# Patient Record
Sex: Male | Born: 1970
Health system: Southern US, Community
[De-identification: ages and names within clinical notes are randomized; demographics above are authoritative.]

## PROBLEM LIST (undated history)

## (undated) DIAGNOSIS — M545 Low back pain, unspecified: Secondary | ICD-10-CM

## (undated) DIAGNOSIS — I10 Essential (primary) hypertension: Secondary | ICD-10-CM

## (undated) DIAGNOSIS — M542 Cervicalgia: Secondary | ICD-10-CM

## (undated) DIAGNOSIS — G8929 Other chronic pain: Secondary | ICD-10-CM

## (undated) DIAGNOSIS — N529 Male erectile dysfunction, unspecified: Secondary | ICD-10-CM

## (undated) DIAGNOSIS — E119 Type 2 diabetes mellitus without complications: Secondary | ICD-10-CM

## (undated) DIAGNOSIS — M25569 Pain in unspecified knee: Secondary | ICD-10-CM

## (undated) DIAGNOSIS — N200 Calculus of kidney: Secondary | ICD-10-CM

## (undated) DIAGNOSIS — Z87442 Personal history of urinary calculi: Secondary | ICD-10-CM

## (undated) DIAGNOSIS — M1990 Unspecified osteoarthritis, unspecified site: Secondary | ICD-10-CM

## (undated) HISTORY — DX: Male erectile dysfunction, unspecified: N52.9

## (undated) HISTORY — PX: SHOULDER SURGERY: SHX246

## (undated) HISTORY — DX: Low back pain: M54.5

## (undated) HISTORY — DX: Low back pain, unspecified: M54.50

## (undated) HISTORY — DX: Calculus of kidney: N20.0

---

## 2001-08-18 ENCOUNTER — Encounter: Payer: Self-pay | Admitting: Emergency Medicine

## 2001-08-18 ENCOUNTER — Emergency Department (HOSPITAL_COMMUNITY): Admission: EM | Admit: 2001-08-18 | Discharge: 2001-08-18 | Payer: Self-pay | Admitting: *Deleted

## 2001-09-09 ENCOUNTER — Encounter: Payer: Self-pay | Admitting: Emergency Medicine

## 2001-09-09 ENCOUNTER — Emergency Department (HOSPITAL_COMMUNITY): Admission: EM | Admit: 2001-09-09 | Discharge: 2001-09-09 | Payer: Self-pay | Admitting: Emergency Medicine

## 2004-07-23 ENCOUNTER — Emergency Department (HOSPITAL_COMMUNITY): Admission: EM | Admit: 2004-07-23 | Discharge: 2004-07-23 | Payer: Self-pay | Admitting: Emergency Medicine

## 2007-08-16 ENCOUNTER — Emergency Department (HOSPITAL_COMMUNITY): Admission: EM | Admit: 2007-08-16 | Discharge: 2007-08-16 | Payer: Self-pay | Admitting: Emergency Medicine

## 2010-05-04 ENCOUNTER — Emergency Department (HOSPITAL_COMMUNITY): Admission: EM | Admit: 2010-05-04 | Discharge: 2010-05-04 | Payer: Self-pay | Admitting: Emergency Medicine

## 2010-11-14 ENCOUNTER — Emergency Department (HOSPITAL_COMMUNITY): Admission: EM | Admit: 2010-11-14 | Discharge: 2010-11-14 | Payer: Self-pay | Admitting: Emergency Medicine

## 2011-01-13 ENCOUNTER — Ambulatory Visit (HOSPITAL_COMMUNITY)
Admission: RE | Admit: 2011-01-13 | Discharge: 2011-01-13 | Payer: Self-pay | Source: Home / Self Care | Attending: Family Medicine | Admitting: Family Medicine

## 2011-03-08 LAB — CBC
HCT: 41.7 % (ref 39.0–52.0)
Hemoglobin: 14.6 g/dL (ref 13.0–17.0)
MCHC: 35.1 g/dL (ref 30.0–36.0)
Platelets: 197 10*3/uL (ref 150–400)
RDW: 13.3 % (ref 11.5–15.5)

## 2011-03-08 LAB — BASIC METABOLIC PANEL
BUN: 7 mg/dL (ref 6–23)
CO2: 30 mEq/L (ref 19–32)
GFR calc non Af Amer: >60 mL/min (ref >60)
Glucose, Bld: 107 mg/dL — ABNORMAL HIGH (ref 70–99)
Potassium: 3.6 mEq/L (ref 3.5–5.1)
Sodium: 141 mEq/L (ref 135–145)

## 2011-03-08 LAB — DIFFERENTIAL
Basophils Absolute: 0 10*3/uL (ref 0.0–0.1)
Basophils Relative: 0 % (ref 0–1)
Eosinophils Absolute: 0.1 10*3/uL (ref 0.0–0.7)
Eosinophils Relative: 1 % (ref 0–5)
Monocytes Absolute: 0.4 10*3/uL (ref 0.1–1.0)

## 2011-03-08 LAB — POCT CARDIAC MARKERS

## 2011-05-04 ENCOUNTER — Ambulatory Visit: Payer: Self-pay | Admitting: Orthopedic Surgery

## 2011-05-04 ENCOUNTER — Encounter: Payer: Self-pay | Admitting: Orthopedic Surgery

## 2011-06-01 ENCOUNTER — Ambulatory Visit (INDEPENDENT_AMBULATORY_CARE_PROVIDER_SITE_OTHER): Payer: BC Managed Care – PPO | Admitting: Orthopedic Surgery

## 2011-06-01 ENCOUNTER — Encounter: Payer: Self-pay | Admitting: Orthopedic Surgery

## 2011-06-01 VITALS — HR 80 | Resp 18 | Ht 71.0 in | Wt 204.0 lb

## 2011-06-01 DIAGNOSIS — M6740 Ganglion, unspecified site: Secondary | ICD-10-CM

## 2011-06-01 MED ORDER — METHYLPREDNISOLONE ACETATE 40 MG/ML IJ SUSP: 40.0000 mg | Freq: Once | INTRAMUSCULAR | Status: DC

## 2011-06-01 NOTE — Procedures (Signed)
Ganglion cyst aspiration  Local consent  Time out  Medications used Depo-Medrol 1/2 cc  Cleansing agent alcohol  Anesthesia with ethyl chloride  18-gauge needle was used to aspirate the cyst on the back of the RIGHT hand.  We obtained gelatinous fluid consistent with a cyst  No complications were noted

## 2011-06-01 NOTE — Progress Notes (Signed)
New patient  Mass RIGHT wrist/hand  40 year old male no history of any major trauma presents with a mass on the dorsum of his RIGHT hand which is changed in size over the last few months associated with some mild discomfort.  Medical history has been reviewed above.  Review of systems  Vital signs are stable as recorded  General appearance is normal  The patient is alert and oriented x3  The patient's mood and affect are normal  The patient is ambulating with a normal gait pattern  The cardiovascular exam reveals normal pulses and temperature without edema swelling.  The lymphatic system is negative for palpable lymph nodes  The sensory exam is normal.  There are no pathologic reflexes.  Right-hand  Inspection reveals a mass approximately 5 x 5 mm on the dorsum of the RIGHT hand primarily over the ring finger area of the carpus.  It is firm nodular cystic in nature mobile under the skin consistent with a ganglion.  Normal range of motion and grip strength are noted.  Skin is otherwise warm dry and intact.  Wrist joint is stable.  X-rays were obtained with a marker.  X-rays are normal.  Aspiration revealed 2-3 cc of gelatinous fluid consistent with ganglion.  Cortisone was injected.  Ace Wrap applied x24 hours then removed  My treatment protocol his 2 aspirations and then if the mass returns excision  Followup as needed

## 2012-03-05 ENCOUNTER — Encounter (HOSPITAL_COMMUNITY): Payer: Self-pay | Admitting: Emergency Medicine

## 2012-03-05 ENCOUNTER — Emergency Department (HOSPITAL_COMMUNITY)
Admission: EM | Admit: 2012-03-05 | Discharge: 2012-03-05 | Disposition: A | Discharge status: Home / Self Care | Payer: BC Managed Care – PPO | Attending: Emergency Medicine | Admitting: Emergency Medicine

## 2012-03-05 ENCOUNTER — Emergency Department (HOSPITAL_COMMUNITY): Payer: BC Managed Care – PPO

## 2012-03-05 DIAGNOSIS — S339XXA Sprain of unspecified parts of lumbar spine and pelvis, initial encounter: Secondary | ICD-10-CM | POA: Insufficient documentation

## 2012-03-05 DIAGNOSIS — M549 Dorsalgia, unspecified: Secondary | ICD-10-CM | POA: Insufficient documentation

## 2012-03-05 DIAGNOSIS — R10813 Right lower quadrant abdominal tenderness: Secondary | ICD-10-CM | POA: Insufficient documentation

## 2012-03-05 DIAGNOSIS — S39012A Strain of muscle, fascia and tendon of lower back, initial encounter: Secondary | ICD-10-CM

## 2012-03-05 DIAGNOSIS — X500XXA Overexertion from strenuous movement or load, initial encounter: Secondary | ICD-10-CM | POA: Insufficient documentation

## 2012-03-05 DIAGNOSIS — IMO0002 Reserved for concepts with insufficient information to code with codable children: Secondary | ICD-10-CM | POA: Insufficient documentation

## 2012-03-05 DIAGNOSIS — R109 Unspecified abdominal pain: Secondary | ICD-10-CM | POA: Insufficient documentation

## 2012-03-05 DIAGNOSIS — R110 Nausea: Secondary | ICD-10-CM | POA: Insufficient documentation

## 2012-03-05 DIAGNOSIS — S39011A Strain of muscle, fascia and tendon of abdomen, initial encounter: Secondary | ICD-10-CM

## 2012-03-05 DIAGNOSIS — Z809 Family history of malignant neoplasm, unspecified: Secondary | ICD-10-CM | POA: Insufficient documentation

## 2012-03-05 LAB — BASIC METABOLIC PANEL
BUN: 13 mg/dL (ref 6–23)
Calcium: 9.7 mg/dL (ref 8.4–10.5)
Chloride: 104 mEq/L (ref 96–112)
Creatinine, Ser: 1.06 mg/dL (ref 0.50–1.35)
GFR calc Af Amer: >90 mL/min (ref >90)

## 2012-03-05 LAB — DIFFERENTIAL
Basophils Relative: 0 % (ref 0–1)
Eosinophils Relative: 2 % (ref 0–5)
Monocytes Absolute: 0.6 10*3/uL (ref 0.1–1.0)
Monocytes Relative: 11 % (ref 3–12)
Neutro Abs: 2.8 10*3/uL (ref 1.7–7.7)

## 2012-03-05 LAB — URINALYSIS, ROUTINE W REFLEX MICROSCOPIC
Bilirubin Urine: NEGATIVE
Glucose, UA: NEGATIVE mg/dL
Ketones, ur: NEGATIVE mg/dL
Nitrite: NEGATIVE
Protein, ur: NEGATIVE mg/dL
pH: 6.5 (ref 5.0–8.0)

## 2012-03-05 LAB — CBC
HCT: 40.6 % (ref 39.0–52.0)
Hemoglobin: 14.4 g/dL (ref 13.0–17.0)
MCH: 31.2 pg (ref 26.0–34.0)
MCHC: 35.5 g/dL (ref 30.0–36.0)
MCV: 87.9 fL (ref 78.0–100.0)
RDW: 12.9 % (ref 11.5–15.5)

## 2012-03-05 MED ORDER — NAPROXEN 500 MG PO TABS: 500.0000 mg | ORAL_TABLET | Freq: Two times a day (BID) | ORAL | Status: AC

## 2012-03-05 MED ORDER — ONDANSETRON HCL 4 MG/2ML IJ SOLN
4.0000 mg | Freq: Once | INTRAMUSCULAR | Status: AC
  Administered 2012-03-05: 4 mg via INTRAVENOUS
  Filled 2012-03-05: qty 2

## 2012-03-05 MED ORDER — SODIUM CHLORIDE 0.9 % IV BOLUS (SEPSIS)
1000.0000 mL | Freq: Once | INTRAVENOUS | Status: AC
  Administered 2012-03-05: 1000 mL via INTRAVENOUS

## 2012-03-05 MED ORDER — KETOROLAC TROMETHAMINE 30 MG/ML IJ SOLN
30.0000 mg | Freq: Once | INTRAMUSCULAR | Status: AC
  Administered 2012-03-05: 30 mg via INTRAVENOUS
  Filled 2012-03-05: qty 1

## 2012-03-05 MED ORDER — HYDROCODONE-ACETAMINOPHEN 5-500 MG PO TABS: 1.0000 | ORAL_TABLET | Freq: Four times a day (QID) | ORAL | Status: AC | PRN

## 2012-03-05 NOTE — Discharge Instructions (Signed)

## 2012-03-05 NOTE — ED Notes (Signed)
Pt c/o rlq "burning pain" . And lower back pain. Pt states he had diarrhea x 1 week ago and has been having soft stools the past few days. Denies n/v.

## 2012-03-05 NOTE — ED Provider Notes (Signed)
History     CSN: [id number]  Arrival date & time 03/05/12  1610   First MD Initiated Contact with Patient 03/05/12 1904      Chief Complaint  Patient presents with  . Abdominal Pain  . Back Pain    (Consider location/radiation/quality/duration/timing/severity/associated sxs/prior treatment) Patient is a 41 y.o. male presenting with abdominal pain and back pain. The history is provided by the patient. No language interpreter was used.  Abdominal Pain The primary symptoms of the illness include abdominal pain and nausea. The primary symptoms of the illness do not include fever, fatigue, shortness of breath, vomiting, diarrhea or dysuria. The current episode started yesterday. The onset of the illness was gradual. The problem has been gradually worsening.  The pain came on gradually. The abdominal pain has been gradually worsening since its onset. The abdominal pain is located in the RLQ. The abdominal pain does not radiate. The abdominal pain is relieved by nothing. The abdominal pain is exacerbated by movement and certain positions.  Additional symptoms associated with the illness include back pain. Symptoms associated with the illness do not include chills, anorexia, diaphoresis, constipation, urgency, hematuria or frequency.  Back Pain  This is a new problem. The current episode started yesterday. The problem occurs constantly. The problem has been gradually worsening. The pain is associated with lifting heavy objects. The pain is present in the lumbar spine. The quality of the pain is described as aching. The pain does not radiate. The pain is moderate. The symptoms are aggravated by certain positions. The pain is the same all the time. Associated symptoms include abdominal pain. Pertinent negatives include no chest pain, no fever, no numbness, no headaches, no dysuria and no weakness. He has tried nothing for the symptoms.    History reviewed. No pertinent past medical history.  Past  Surgical History  Procedure Date  . Shoulder surgery     Family History  Problem Relation Age of Onset  . Arthritis    . Cancer      History  Substance Use Topics  . Smoking status: Never Smoker   . Smokeless tobacco: Not on file  . Alcohol Use: Yes     3 beers a week      Review of Systems  Constitutional: Negative for fever, chills, diaphoresis, activity change, appetite change and fatigue.  HENT: Negative for congestion, sore throat, rhinorrhea, neck pain and neck stiffness.   Respiratory: Negative for cough and shortness of breath.   Cardiovascular: Negative for chest pain and palpitations.  Gastrointestinal: Positive for nausea and abdominal pain. Negative for vomiting, diarrhea, constipation, blood in stool and anorexia.  Genitourinary: Positive for flank pain. Negative for dysuria, urgency, frequency, hematuria and testicular pain.  Musculoskeletal: Positive for back pain. Negative for myalgias and arthralgias.  Neurological: Negative for dizziness, weakness, light-headedness, numbness and headaches.  All other systems reviewed and are negative.    Allergies  Review of patient's allergies indicates no known allergies.  Home Medications   Current Outpatient Rx  Name Route Sig Dispense Refill  . HYDROCODONE-ACETAMINOPHEN 5-500 MG PO TABS Oral Take 1-2 tablets by mouth every 6 (six) hours as needed for pain. 15 tablet 0  . NAPROXEN 500 MG PO TABS Oral Take 1 tablet (500 mg total) by mouth 2 (two) times daily. 30 tablet 0    BP 125/83  Pulse 71  Temp(Src) 98.5 F (36.9 C) (Oral)  Resp 18  SpO2 100%  Physical Exam  Nursing note and vitals reviewed. Constitutional:  He is oriented to person, place, and time. He appears well-developed and well-nourished. No distress.  HENT:  Head: Normocephalic and atraumatic.  Mouth/Throat: Oropharynx is clear and moist.  Eyes: Conjunctivae and EOM are normal. Pupils are equal, round, and reactive to light.  Neck: Normal  range of motion. Neck supple.  Cardiovascular: Normal rate, regular rhythm, normal heart sounds and intact distal pulses.  Exam reveals no gallop and no friction rub.   No murmur heard. Pulmonary/Chest: Effort normal and breath sounds normal. No respiratory distress. He exhibits no tenderness.  Abdominal: Soft. Bowel sounds are normal. There is tenderness (RLQ abd tenderness). There is no rebound and no guarding.       No cva tenderness.  Pain on palpation of the lateral musculature of the lumbar spine region.  No midline tenderness  Neurological: He is alert and oriented to person, place, and time. No cranial nerve deficit.  Skin: Skin is warm and dry. No rash noted.    ED Course  Procedures (including critical care time)  Labs Reviewed  BASIC METABOLIC PANEL - Abnormal; Notable for the following:    Glucose, Bld 122 (*)    GFR calc non Af Amer 84 (*)    All other components within normal limits  CBC  DIFFERENTIAL  URINALYSIS, ROUTINE W REFLEX MICROSCOPIC   Ct Abdomen Pelvis Wo Contrast  03/05/2012  *RADIOLOGY REPORT*  Clinical Data: Flank and abdominal pain  CT ABDOMEN AND PELVIS WITHOUT CONTRAST  Technique:  Multidetector CT imaging of the abdomen and pelvis was performed following the standard protocol without intravenous contrast.  Comparison: None  Findings: The lung bases are clear.  No pericardial or pleural effusion.  No focal liver abnormality identified.  The gallbladder appears normal.  There is no biliary dilatation. The pancreas is normal.  The adrenal glands both appear normal.  The spleen is noted.  The left kidney is normal.  The right kidney is normal.  There is no evidence for nephrolithiasis or hydronephrosis.  No upper abdominal adenopathy.  There is no pelvic or inguinal adenopathy.  Urinary bladder appears normal.  The stomach and the small bowel loops are negative.  The appendix is identified and appears normal.  Normal appearance of the colon.  IMPRESSION:  1.  No  acute findings identified within the abdomen or pelvis.  Original Report Authenticated By: Rosealee Albee, M.D.     1. Abdominal wall strain   2. Lumbosacral strain       MDM  A CT was performed given the presence of her lower quadrant pain and flank pain. Kidney stone and appendicitis adequately ruled out. He'll the symptoms are secondary to abdominal wall and lumbosacral strain. 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 There is no urinary symptoms. He'll be discharged home with pain control. Provided strict return precautions in case there is worsening of his abdominal pain. Instructed to followup with his primary care physician.        Dayton Bailiff, MD 03/05/12 2112

## 2013-03-22 ENCOUNTER — Encounter: Payer: Self-pay | Admitting: *Deleted

## 2013-03-23 ENCOUNTER — Ambulatory Visit: Payer: Self-pay | Admitting: Family Medicine

## 2013-03-29 ENCOUNTER — Ambulatory Visit (INDEPENDENT_AMBULATORY_CARE_PROVIDER_SITE_OTHER): Payer: BC Managed Care – PPO | Admitting: Family Medicine

## 2013-03-29 ENCOUNTER — Encounter: Payer: Self-pay | Admitting: Family Medicine

## 2013-03-29 ENCOUNTER — Ambulatory Visit (HOSPITAL_COMMUNITY)
Admission: RE | Admit: 2013-03-29 | Discharge: 2013-03-29 | Disposition: A | Discharge status: Home / Self Care | Payer: BC Managed Care – PPO | Source: Ambulatory Visit | Attending: Family Medicine | Admitting: Family Medicine

## 2013-03-29 VITALS — BP 124/86 | HR 70 | Wt 201.0 lb

## 2013-03-29 DIAGNOSIS — M25579 Pain in unspecified ankle and joints of unspecified foot: Secondary | ICD-10-CM

## 2013-03-29 DIAGNOSIS — M25571 Pain in right ankle and joints of right foot: Secondary | ICD-10-CM

## 2013-03-29 DIAGNOSIS — M79609 Pain in unspecified limb: Secondary | ICD-10-CM | POA: Insufficient documentation

## 2013-03-29 MED ORDER — DICLOFENAC SODIUM 75 MG PO TBEC: 75.0000 mg | DELAYED_RELEASE_TABLET | Freq: Two times a day (BID) | ORAL | Status: DC

## 2013-03-29 NOTE — Patient Instructions (Signed)
Plantar Fasciitis Plantar fasciitis is a common condition that causes foot pain. It is soreness (inflammation) of the band of tough fibrous tissue on the bottom of the foot that runs from the heel bone (calcaneus) to the ball of the foot. The cause of this soreness may be from excessive standing, poor fitting shoes, running on hard surfaces, being overweight, having an abnormal walk, or overuse (this is common in runners) of the painful foot or feet. It is also common in aerobic exercise dancers and ballet dancers. SYMPTOMS  Most people with plantar fasciitis complain of:  Severe pain in the morning on the bottom of their foot especially when taking the first steps out of bed. This pain recedes after a few minutes of walking.  Severe pain is experienced also during walking following a long period of inactivity.  Pain is worse when walking barefoot or up stairs DIAGNOSIS   Your caregiver will diagnose this condition by examining and feeling your foot.  Special tests such as X-rays of your foot, are usually not needed. PREVENTION   Consult a sports medicine professional before beginning a new exercise program.  Walking programs offer a good workout. With walking there is a lower chance of overuse injuries common to runners. There is less impact and less jarring of the joints.  Begin all new exercise programs slowly. If problems or pain develop, decrease the amount of time or distance until you are at a comfortable level.  Wear good shoes and replace them regularly.  Stretch your foot and the heel cords at the back of the ankle (Achilles tendon) both before and after exercise.  Run or exercise on even surfaces that are not hard. For example, asphalt is better than pavement.  Do not run barefoot on hard surfaces.  If using a treadmill, vary the incline.  Do not continue to workout if you have foot or joint problems. Seek professional help if they do not improve. HOME CARE INSTRUCTIONS     Avoid activities that cause you pain until you recover.  Use ice or cold packs on the problem or painful areas after working out.  Only take over-the-counter or prescription medicines for pain, discomfort, or fever as directed by your caregiver.  Soft shoe inserts or athletic shoes with air or gel sole cushions may be helpful.  If problems continue or become more severe, consult a sports medicine caregiver or your own health care provider. Cortisone is a potent anti-inflammatory medication that may be injected into the painful area. You can discuss this treatment with your caregiver. MAKE SURE YOU:   Understand these instructions.  Will watch your condition.  Will get help right away if you are not doing well or get worse. Document Released: 08/31/2001 Document Revised: 02/28/2012 Document Reviewed: 10/30/2008 [facility] Patient Information 2013 [place], [place]. Plantar Fasciitis Plantar fasciitis is a common condition that causes foot pain. It is soreness (inflammation) of the band of tough fibrous tissue on the bottom of the foot that runs from the heel bone (calcaneus) to the ball of the foot. The cause of this soreness may be from excessive standing, poor fitting shoes, running on hard surfaces, being overweight, having an abnormal walk, or overuse (this is common in runners) of the painful foot or feet. It is also common in aerobic exercise dancers and ballet dancers. SYMPTOMS  Most people with plantar fasciitis complain of:  Severe pain in the morning on the bottom of their foot especially when taking the first steps out of  bed. This pain recedes after a few minutes of walking.  Severe pain is experienced also during walking following a long period of inactivity.  Pain is worse when walking barefoot or up stairs DIAGNOSIS   Your caregiver will diagnose this condition by examining and feeling your foot.  Special tests such as X-rays of your foot, are usually not  needed. PREVENTION   Consult a sports medicine professional before beginning a new exercise program.  Walking programs offer a good workout. With walking there is a lower chance of overuse injuries common to runners. There is less impact and less jarring of the joints.  Begin all new exercise programs slowly. If problems or pain develop, decrease the amount of time or distance until you are at a comfortable level.  Wear good shoes and replace them regularly.  Stretch your foot and the heel cords at the back of the ankle (Achilles tendon) both before and after exercise.  Run or exercise on even surfaces that are not hard. For example, asphalt is better than pavement.  Do not run barefoot on hard surfaces.  If using a treadmill, vary the incline.  Do not continue to workout if you have foot or joint problems. Seek professional help if they do not improve. HOME CARE INSTRUCTIONS   Avoid activities that cause you pain until you recover.  Use ice or cold packs on the problem or painful areas after working out.  Only take over-the-counter or prescription medicines for pain, discomfort, or fever as directed by your caregiver.  Soft shoe inserts or athletic shoes with air or gel sole cushions may be helpful.  If problems continue or become more severe, consult a sports medicine caregiver or your own health care provider. Cortisone is a potent anti-inflammatory medication that may be injected into the painful area. You can discuss this treatment with your caregiver. MAKE SURE YOU:   Understand these instructions.  Will watch your condition.  Will get help right away if you are not doing well or get worse. Document Released: 08/31/2001 Document Revised: 02/28/2012 Document Reviewed: 10/30/2008 [facility] Patient Information 2013 [place], [place].   Dr scholl's footpads plus heel gel cups in both shoes

## 2013-03-29 NOTE — Progress Notes (Signed)
  Subjective:    Patient ID: Derek Jones, male    DOB: August 30, 1971, 42 y.o.   MRN: [id number]  Foot Pain This is a new problem. The current episode started more than 1 month ago. The problem occurs constantly. The problem has been gradually worsening. The symptoms are aggravated by standing (over time plus standing plus steel toe shoes.). He has tried NSAIDs for the symptoms. The treatment provided mild relief.      Review of Systems  All other systems reviewed and are negative.       Objective:   Physical Exam   Alert no acute distress. Lungs clear. Heart regular in rhythm. Foot distal pain. Positive tenderness to deep pressure. Primarily plantar surface. Also notes significant heel pain.     Assessment & Plan:  Impression plantar fascitis with metatarsalgia. Likely related to over time on concrete was steel toed shoes plan Dr. Margart Sickles foot pads with viscus heel cup bilateral. X-ray and follow foot. Voltaren 75 twice a day with food when necessary. WSL

## 2013-04-02 ENCOUNTER — Encounter (HOSPITAL_COMMUNITY): Payer: Self-pay | Admitting: *Deleted

## 2013-04-02 ENCOUNTER — Emergency Department (HOSPITAL_COMMUNITY): Payer: BC Managed Care – PPO

## 2013-04-02 ENCOUNTER — Emergency Department (HOSPITAL_COMMUNITY)
Admission: EM | Admit: 2013-04-02 | Discharge: 2013-04-02 | Disposition: A | Discharge status: Home / Self Care | Payer: BC Managed Care – PPO | Attending: Emergency Medicine | Admitting: Emergency Medicine

## 2013-04-02 DIAGNOSIS — X500XXA Overexertion from strenuous movement or load, initial encounter: Secondary | ICD-10-CM | POA: Insufficient documentation

## 2013-04-02 DIAGNOSIS — Z87442 Personal history of urinary calculi: Secondary | ICD-10-CM | POA: Insufficient documentation

## 2013-04-02 DIAGNOSIS — S46909A Unspecified injury of unspecified muscle, fascia and tendon at shoulder and upper arm level, unspecified arm, initial encounter: Secondary | ICD-10-CM | POA: Insufficient documentation

## 2013-04-02 DIAGNOSIS — M25512 Pain in left shoulder: Secondary | ICD-10-CM

## 2013-04-02 DIAGNOSIS — Z87448 Personal history of other diseases of urinary system: Secondary | ICD-10-CM | POA: Insufficient documentation

## 2013-04-02 DIAGNOSIS — Y929 Unspecified place or not applicable: Secondary | ICD-10-CM | POA: Insufficient documentation

## 2013-04-02 DIAGNOSIS — Y9389 Activity, other specified: Secondary | ICD-10-CM | POA: Insufficient documentation

## 2013-04-02 DIAGNOSIS — S4980XA Other specified injuries of shoulder and upper arm, unspecified arm, initial encounter: Secondary | ICD-10-CM | POA: Insufficient documentation

## 2013-04-02 DIAGNOSIS — W1809XA Striking against other object with subsequent fall, initial encounter: Secondary | ICD-10-CM | POA: Insufficient documentation

## 2013-04-02 MED ORDER — PREDNISONE 20 MG PO TABS: ORAL_TABLET | ORAL | Status: DC

## 2013-04-02 MED ORDER — OXYCODONE-ACETAMINOPHEN 5-325 MG PO TABS: 2.0000 | ORAL_TABLET | ORAL | Status: DC | PRN

## 2013-04-02 NOTE — ED Notes (Signed)
nad noted prior to dc. Dc instructions reviewed and explained and 2 scripts given to pt

## 2013-04-02 NOTE — ED Provider Notes (Signed)
History  This chart was scribed for Derek Hutching, MD by Bennett Scrape, ED Scribe. This patient was seen in room APFT24/APFT24 and the patient's care was started at 3:07 PM.  CSN: [id number]  Arrival date & time 04/02/13  1438   First MD Initiated Contact with Patient 04/02/13 1507      Chief Complaint  Patient presents with  . Shoulder Pain    The history is provided by the patient. No language interpreter was used.    ROHNAN BARTLESON is a 42 y.o. male who presents to the Emergency Department complaining of sudden onset, non-changing, constant left shoulder pain that started after a fall. Pt states that he fell backwards and landed on his left arm stretched out back behind him. He states that he felt a "pop" at the time and has experienced decreased ROM secondary to pain. He states that he has been taking ibuprofen with mild improvement. He denies any other injuries or symptoms currently. He has a h/o ED and kidney stones but denies being on any daily medications. Pt is an occasional alcohol user but denies smoking.   Past Medical History  Diagnosis Date  . ED (erectile dysfunction)   . Low back pain   . Kidney stone     Past Surgical History  Procedure Laterality Date  . Shoulder surgery      Family History  Problem Relation Age of Onset  . Arthritis    . Cancer    . Diabetes Mother   . Stroke Maternal Grandmother   . Hypertension Maternal Grandmother     History  Substance Use Topics  . Smoking status: Never Smoker   . Smokeless tobacco: Not on file  . Alcohol Use: Yes     Comment: 3 beers a week      Review of Systems  A complete 10 system review of systems was obtained and all systems are negative except as noted in the HPI and PMH.   Allergies  Review of patient's allergies indicates no known allergies.  Home Medications   Current Outpatient Rx  Name  Route  Sig  Dispense  Refill  . diclofenac (VOLTAREN) 75 MG EC tablet   Oral   Take 1 tablet (75  mg total) by mouth 2 (two) times daily.   60 tablet   1     Triage Vitals: BP 123/84  Pulse 89  Temp(Src) 97.4 F (36.3 C) (Oral)  Resp 18  Ht 5\' 10"  (1.778 m)  Wt 202 lb (91.627 kg)  BMI 28.98 kg/m2  SpO2 99%  Physical Exam  Nursing note and vitals reviewed. Constitutional: He is oriented to person, place, and time. He appears well-developed and well-nourished. No distress.  HENT:  Head: Normocephalic and atraumatic.  Eyes: EOM are normal.  Neck: Neck supple. No tracheal deviation present.  Cardiovascular: Normal rate.   Pulmonary/Chest: Effort normal. No respiratory distress.  Musculoskeletal: Normal range of motion.  Most tender along the posterior left shoulder and superior scapular area, good ROM, radial pulse intact in the left arm, no deformities    Neurological: He is alert and oriented to person, place, and time.  Skin: Skin is warm and dry.  Psychiatric: He has a normal mood and affect. His behavior is normal.    ED Course  Procedures (including critical care time)  DIAGNOSTIC STUDIES: Oxygen Saturation is 99% on room air, normal by my interpretation.    COORDINATION OF CARE: 3:10 PM- Discussed treatment plan which includes prednisone and  pain medication with pt at bedside and pt agreed to plan. Also advised pt to use ice and ibuprofen for pain control as well. Informed pt that if symptoms continue, he should f/u with an orthopedist.  Labs Reviewed - No data to display No results found.   No diagnosis found.    MDM  Patient has full range of motion of left shoulder. No x-rays necessary today. Recommend orthopedic followup. Patient agrees  I personally performed the services described in this documentation, which was scribed in my presence. The recorded information has been reviewed and is accurate.        Derek Hutching, MD 04/04/13 1059

## 2013-04-02 NOTE — ED Notes (Addendum)
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  Painful motion.

## 2013-04-03 ENCOUNTER — Ambulatory Visit: Payer: BC Managed Care – PPO | Admitting: Family Medicine

## 2013-04-06 ENCOUNTER — Ambulatory Visit: Payer: BC Managed Care – PPO | Admitting: Family Medicine

## 2013-04-06 ENCOUNTER — Ambulatory Visit: Payer: BC Managed Care – PPO | Admitting: Nurse Practitioner

## 2013-04-10 ENCOUNTER — Encounter: Payer: Self-pay | Admitting: Family Medicine

## 2013-04-10 ENCOUNTER — Ambulatory Visit (INDEPENDENT_AMBULATORY_CARE_PROVIDER_SITE_OTHER): Payer: BC Managed Care – PPO | Admitting: Family Medicine

## 2013-04-10 VITALS — BP 110/80 | Ht 70.0 in | Wt 202.0 lb

## 2013-04-10 DIAGNOSIS — M25512 Pain in left shoulder: Secondary | ICD-10-CM

## 2013-04-10 DIAGNOSIS — M25519 Pain in unspecified shoulder: Secondary | ICD-10-CM

## 2013-04-10 NOTE — Progress Notes (Signed)
  Subjective:    Patient ID: Derek Jones, male    DOB: [birth year], 42 y.o.   MRN: [id number]  HPI Saw [facility]. Plain x-ray. Pt as scheduled. Ongoing pain. Still hurting with motion. No sig f-u scheduled post Fell backwards on outstretched arm. His pretty sure he felt as shoulder jump out of place. Has a history of recurrent dislocation of same shoulder. None for 10 years. Deep ache. Saw the orthopedic physical therapy to start soon. Anti-inflammatory helping a little but still very painful at night.  Review of Systems ROS otherwise negative.    Objective:   Physical Exam   Alert no acute distress. HEENT normal. Lungs clear. Heart regular in rhythm. Shoulder positive impingement sign. Distal strength sensation intact.     Assessment & Plan:  Impression probable transient dislocation of shoulder with residual tendon/cartilage inflammation. Plan may add oxycodone each bedtime when necessary for pain. Local measures discussed. Press on with physical therapy. Followup with [facility] scheduled. WSL

## 2013-04-16 DIAGNOSIS — Z0289 Encounter for other administrative examinations: Secondary | ICD-10-CM

## 2013-10-23 ENCOUNTER — Ambulatory Visit: Payer: BC Managed Care – PPO | Admitting: Family Medicine

## 2013-10-31 ENCOUNTER — Ambulatory Visit: Payer: BC Managed Care – PPO | Admitting: Family Medicine

## 2013-12-24 ENCOUNTER — Encounter: Payer: Self-pay | Admitting: Family Medicine

## 2013-12-24 ENCOUNTER — Ambulatory Visit (INDEPENDENT_AMBULATORY_CARE_PROVIDER_SITE_OTHER): Payer: BC Managed Care – PPO | Admitting: Family Medicine

## 2013-12-24 ENCOUNTER — Ambulatory Visit (HOSPITAL_COMMUNITY)
Admission: RE | Admit: 2013-12-24 | Discharge: 2013-12-24 | Disposition: A | Discharge status: Home / Self Care | Payer: BC Managed Care – PPO | Source: Ambulatory Visit | Attending: Family Medicine | Admitting: Family Medicine

## 2013-12-24 VITALS — BP 100/70 | Ht 70.0 in | Wt 205.2 lb

## 2013-12-24 DIAGNOSIS — M259 Joint disorder, unspecified: Secondary | ICD-10-CM | POA: Insufficient documentation

## 2013-12-24 DIAGNOSIS — M79671 Pain in right foot: Secondary | ICD-10-CM

## 2013-12-24 DIAGNOSIS — M79609 Pain in unspecified limb: Secondary | ICD-10-CM

## 2013-12-24 DIAGNOSIS — M25562 Pain in left knee: Secondary | ICD-10-CM

## 2013-12-24 DIAGNOSIS — M25569 Pain in unspecified knee: Secondary | ICD-10-CM | POA: Insufficient documentation

## 2013-12-24 MED ORDER — CHLORZOXAZONE 500 MG PO TABS: 500.0000 mg | ORAL_TABLET | Freq: Four times a day (QID) | ORAL | Status: DC | PRN

## 2013-12-24 MED ORDER — ETODOLAC 400 MG PO TABS: 400.0000 mg | ORAL_TABLET | Freq: Two times a day (BID) | ORAL | Status: DC

## 2013-12-24 NOTE — Progress Notes (Signed)
   Subjective:    Patient ID: Derek Jones, male    DOB: 25-Jun-1971, 43 y.o.   MRN: [id number]  Knee Pain  The incident occurred more than 1 week ago. There was no injury mechanism. The pain is present in the right heel and left knee. The quality of the pain is described as aching and stabbing. The pain is at a severity of 8/10. The pain is moderate. The pain has been worsening since onset. He reports no foreign bodies present. The symptoms are aggravated by weight bearing. He has tried NSAIDs (epsom salt bath) for the symptoms. The treatment provided no relief.    Works on concrete, wears Science writersteel toes  Wearing insertsa plus gel insoles  Old injury with sports on that knee No swelling, Mobility diminished not wanting to bend  Some achey and stiff at night, and in morn, Gradually worsening, wonders if something serious wrong Not giving away, but hyperext feeling with certain motions  Right heel, possibly sec to adjusting walk,   Pain is severe progressive impaction of patient's ability to do his work. Impacted on his ability to exercise. Progressive over 9 months now. Recalls no major injury of the left knee in the past. Review of Systems No pain elsewhere no fever no chills no chest pain no weight loss ROS otherwise negative    Objective:   Physical Exam  Alert no acute distress vitals stable. Lungs clear. Heart regular in rhythm. Left knee no effusion no crepitations no obvious cruciate laxity. Lateral knee joint tenderness to deep palpation. Positive lateral knee guarding to medial flexion.  Right heell distinct tenderness deep palpation.      Assessment & Plan:  Impression 1 chronic knee pain progressive in nature. Concerned about meniscus and/or cartilage. #2 he'll fasciitis other leg likely secondary to gait disturbance from #1. Plan interventions discussed at length. Appropriate x-rays. Trial of anti-inflammatory. If left knee x-ray negative will proceed with left knee MRI  rationale discussed. WSL

## 2013-12-25 NOTE — Progress Notes (Signed)
Patient notified

## 2013-12-25 NOTE — Addendum Note (Signed)
Addended byOneal Deputy: Marvelene Stoneberg D on: 12/25/2013 02:31 PM   Modules accepted: Orders

## 2014-01-02 ENCOUNTER — Ambulatory Visit (HOSPITAL_COMMUNITY)
Admission: RE | Admit: 2014-01-02 | Discharge: 2014-01-02 | Disposition: A | Discharge status: Home / Self Care | Payer: BC Managed Care – PPO | Source: Ambulatory Visit | Attending: Family Medicine | Admitting: Family Medicine

## 2014-01-02 DIAGNOSIS — M2240 Chondromalacia patellae, unspecified knee: Secondary | ICD-10-CM | POA: Insufficient documentation

## 2014-01-02 DIAGNOSIS — M629 Disorder of muscle, unspecified: Secondary | ICD-10-CM | POA: Insufficient documentation

## 2014-01-02 DIAGNOSIS — M2420 Disorder of ligament, unspecified site: Secondary | ICD-10-CM | POA: Insufficient documentation

## 2014-01-02 DIAGNOSIS — M25562 Pain in left knee: Secondary | ICD-10-CM

## 2014-01-02 DIAGNOSIS — M25569 Pain in unspecified knee: Secondary | ICD-10-CM | POA: Insufficient documentation

## 2014-01-02 DIAGNOSIS — M25469 Effusion, unspecified knee: Secondary | ICD-10-CM | POA: Insufficient documentation

## 2014-01-03 ENCOUNTER — Other Ambulatory Visit: Payer: Self-pay

## 2014-01-03 DIAGNOSIS — M25562 Pain in left knee: Secondary | ICD-10-CM

## 2014-01-31 ENCOUNTER — Telehealth: Payer: Self-pay | Admitting: Family Medicine

## 2014-01-31 NOTE — Telephone Encounter (Signed)
Patient states that he was supposed to have a followup call regarding xray results, but has not heard anything back. Please advise.

## 2014-01-31 NOTE — Telephone Encounter (Signed)
Patient states he already received the results of his xray and mri results. He wants to know the status of the referral to orthopedic surgeon. He has not heard anything concerning this yet.

## 2014-01-31 NOTE — Telephone Encounter (Signed)
[facility] - ? Preference on ortho to see?

## 2014-02-01 NOTE — Telephone Encounter (Signed)
Dr Romeo AppleHarrison done

## 2014-02-13 ENCOUNTER — Ambulatory Visit: Payer: BC Managed Care – PPO | Admitting: Orthopedic Surgery

## 2014-02-22 ENCOUNTER — Encounter: Payer: Self-pay | Admitting: Nurse Practitioner

## 2014-02-22 ENCOUNTER — Ambulatory Visit (INDEPENDENT_AMBULATORY_CARE_PROVIDER_SITE_OTHER): Payer: BC Managed Care – PPO | Admitting: Nurse Practitioner

## 2014-02-22 VITALS — BP 120/86 | Temp 98.4°F | Ht 70.0 in | Wt 204.1 lb

## 2014-02-22 DIAGNOSIS — K529 Noninfective gastroenteritis and colitis, unspecified: Secondary | ICD-10-CM

## 2014-02-22 DIAGNOSIS — K5289 Other specified noninfective gastroenteritis and colitis: Secondary | ICD-10-CM

## 2014-02-22 MED ORDER — ONDANSETRON 8 MG PO TBDP
8.0000 mg | ORAL_TABLET | Freq: Three times a day (TID) | ORAL | Status: DC | PRN
Start: 2014-02-22 — End: 2014-05-25

## 2014-02-22 NOTE — Progress Notes (Signed)
   Subjective:    Patient ID: Derek Jones, male    DOB: 12-19-1971, 43 y.o.   MRN: [id number]  Diarrhea  This is a new problem. The current episode started yesterday. The problem has been unchanged. The stool consistency is described as watery. Associated symptoms include chills. Associated symptoms comments: dizziness. Nothing aggravates the symptoms. There are no known risk factors. He has tried nothing for the symptoms. The treatment provided no relief.   no fever. No diarrhea today. Nausea, no vomiting. Minimal abdominal pain. No known contacts. Began after eating a hotdog last night he said "tasted different". No cough or runny nose. Taking some clear liquids today. Voiding normal limit.    Review of Systems  Constitutional: Positive for chills.  Gastrointestinal: Positive for diarrhea.       Objective:   Physical Exam NAD. Alert, oriented. TMs mild clear effusion, no erythema. Pharynx clear. Because membranes moist. Neck supple with mild soft anterior adenopathy. Lungs clear. Heart regular rate rhythm. Abdomen soft nondistended with active bowel sounds; minimal epigastric area tenderness, otherwise benign.      Assessment & Plan:  Gastroenteritis Viral versus exposure to contaminated food Plan: Meds ordered this encounter  Medications  . ondansetron (ZOFRAN-ODT) 8 MG disintegrating tablet    Sig: Take 1 tablet (8 mg total) by mouth every 8 (eight) hours as needed for nausea or vomiting.    Dispense:  20 tablet    Refill:  0    Order Specific Question:  Supervising Provider    Answer:  Merlyn AlbertLUKING, WILLIAM S [[id number]]   Reviewed dietary measures and warning signs. Call back in 72 hours if no improvement, call or go to ED for the weekend if worse.

## 2014-02-25 ENCOUNTER — Ambulatory Visit (INDEPENDENT_AMBULATORY_CARE_PROVIDER_SITE_OTHER): Payer: BC Managed Care – PPO | Admitting: Orthopedic Surgery

## 2014-02-25 ENCOUNTER — Encounter: Payer: Self-pay | Admitting: Orthopedic Surgery

## 2014-02-25 VITALS — BP 126/85 | Ht 70.0 in | Wt 201.0 lb

## 2014-02-25 DIAGNOSIS — M25562 Pain in left knee: Secondary | ICD-10-CM

## 2014-02-25 DIAGNOSIS — S161XXA Strain of muscle, fascia and tendon at neck level, initial encounter: Secondary | ICD-10-CM

## 2014-02-25 DIAGNOSIS — S139XXA Sprain of joints and ligaments of unspecified parts of neck, initial encounter: Secondary | ICD-10-CM

## 2014-02-25 DIAGNOSIS — M25569 Pain in unspecified knee: Secondary | ICD-10-CM

## 2014-02-25 NOTE — Patient Instructions (Signed)
Stop Aleve and Lodine Start prednisone dose pack take as directed x 12 days

## 2014-02-25 NOTE — Progress Notes (Signed)
Patient ID: Derek MaduraCharles N Jones, male   DOB: July 21, 1971, 43 y.o.   MRN: [id number]  Chief Complaint  Patient presents with  . Knee Pain    Left knee pain, no injury. Consult from Dr. Lubertha SouthSteve Jones    HISTORY: 43 may oh works at [employer] did some sledding there are snowstorms and injured his neck. Wakes up at times with numbness and tingling in his hand depending on which side he slipped on no significant trauma.  He presents really for his left knee which started having difficulty 4-6 months ago. No significant trauma. He does do a lot of climbing at work. His symptoms came on gradually. He now has a dull aching pain behind his knee which is 8-9/10 with occasional lock or catch. It seems to be worse after work. He's tried Aleve over-the-counter and also Lodine with incomplete response in terms of alleviating his symptoms. 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 The Lachman test is normal. The posterior drawer test is normal. Collateral ligaments are stable. Motor exam is grade 5 over 5. Skin is normal.  Inspection of the left knee  Range of motion 0-135 The Lachman test is normal the anterior and posterior drawer tests are normal and the collateral ligaments are stable Motor exam 5/5 Skin normal; no rash or laceration  McMurray's sign  negative Iliotibial band shows mild tenderness, the posterior popliteal fossa area as the area of maximal tenderness Cardiovascular exam normal pulse and perfusion without edema tenderness or varicose veins Sensory exam is normal  Cervical spine exam shows full range of motion mild tenderness in the right trapezius muscle with mild muscle spasm. Reflexes are 2+ and normal upper extremity grip strength is 5 over 5 and normal muscle tone normal. All joints are reduced. Spurling sign negative. Lymph nodes are normal. Balance normal.

## 2014-02-26 DIAGNOSIS — Z0289 Encounter for other administrative examinations: Secondary | ICD-10-CM

## 2014-05-25 ENCOUNTER — Encounter (HOSPITAL_COMMUNITY): Payer: Self-pay | Admitting: Emergency Medicine

## 2014-05-25 ENCOUNTER — Emergency Department (HOSPITAL_COMMUNITY)
Admission: EM | Admit: 2014-05-25 | Discharge: 2014-05-25 | Disposition: A | Discharge status: Home / Self Care | Payer: BC Managed Care – PPO | Attending: Emergency Medicine | Admitting: Emergency Medicine

## 2014-05-25 DIAGNOSIS — J019 Acute sinusitis, unspecified: Secondary | ICD-10-CM

## 2014-05-25 DIAGNOSIS — Z792 Long term (current) use of antibiotics: Secondary | ICD-10-CM | POA: Insufficient documentation

## 2014-05-25 DIAGNOSIS — G8929 Other chronic pain: Secondary | ICD-10-CM | POA: Insufficient documentation

## 2014-05-25 DIAGNOSIS — Z87448 Personal history of other diseases of urinary system: Secondary | ICD-10-CM | POA: Insufficient documentation

## 2014-05-25 DIAGNOSIS — Z87442 Personal history of urinary calculi: Secondary | ICD-10-CM | POA: Insufficient documentation

## 2014-05-25 HISTORY — DX: Cervicalgia: M54.2

## 2014-05-25 HISTORY — DX: Pain in unspecified knee: M25.569

## 2014-05-25 HISTORY — DX: Other chronic pain: G89.29

## 2014-05-25 MED ORDER — AMOXICILLIN 500 MG PO CAPS: 500.0000 mg | ORAL_CAPSULE | Freq: Three times a day (TID) | ORAL | Status: AC

## 2014-05-25 MED ORDER — HYDROCODONE-ACETAMINOPHEN 5-325 MG PO TABS: 1.0000 | ORAL_TABLET | ORAL | Status: DC | PRN

## 2014-05-25 NOTE — Discharge Instructions (Signed)
Sinusitis Sinusitis is redness, soreness, and puffiness (inflammation) of the air pockets in the bones of your face (sinuses). The redness, soreness, and puffiness can cause air and mucus to get trapped in your sinuses. This can allow germs to grow and cause an infection.  HOME CARE   Drink enough fluids to keep your pee (urine) clear or pale yellow.  Use a humidifier in your home.  Run a hot shower to create steam in the bathroom. Sit in the bathroom with the door closed. Breathe in the steam 3 4 times a day.  Put a warm, moist washcloth on your face 3 4 times a day, or as told by your doctor.  Use salt water sprays (saline sprays) to wet the thick fluid in your nose. This can help the sinuses drain.  Only take medicine as told by your doctor. GET HELP RIGHT AWAY IF:   Your pain gets worse.  You have very bad headaches.  You are sick to your stomach (nauseous).  You throw up (vomit).  You are very sleepy (drowsy) all the time.  Your face is puffy (swollen).  Your vision changes.  You have a stiff neck.  You have trouble breathing. MAKE SURE YOU:   Understand these instructions.  Will watch your condition.  Will get help right away if you are not doing well or get worse. Document Released: 05/24/2008 Document Revised: 08/30/2012 Document Reviewed: 07/11/2012 [facility] Patient Information 2014 [place], [place].   As discussed you may also take your ibuprofen for headache pain, 600 mg (or 3 tablets) every 6 hours for headache pain.  Continue taking your sudafed decongestant.

## 2014-05-25 NOTE — ED Notes (Signed)
Complain of sinus headache

## 2014-05-27 NOTE — ED Provider Notes (Signed)
CSN: [id number]     Arrival date & time 05/25/14  1151 History   First MD Initiated Contact with Patient 05/25/14 1350     Chief Complaint  Patient presents with  . Headache     (Consider location/radiation/quality/duration/timing/severity/associated sxs/prior Treatment) HPI Comments: GAGANDEEP SIEGERT is a 43 y.o. Male presenting with nasal congestion and sinus headache for the past several days.  He describes bilateral facial pain and pain behind the eyes in association with nasal congestion, no nasal discharge but increased post nasal drip,  Mild throat irritation and subjective fevers.  He has taken sudafed for the past 2 days without relief.  He denies dizziness, visual changes, photophobia, nausea, vomiting, ear pain or focal weakness.  Additionally denies cough or shortness of breath.     The history is provided by the patient.    Past Medical History  Diagnosis Date  . ED (erectile dysfunction)   . Low back pain   . Kidney stone   . Chronic neck pain   . Chronic knee pain    Past Surgical History  Procedure Laterality Date  . Shoulder surgery     Family History  Problem Relation Age of Onset  . Arthritis    . Cancer    . Diabetes Mother   . Stroke Maternal Grandmother   . Hypertension Maternal Grandmother    History  Substance Use Topics  . Smoking status: Never Smoker   . Smokeless tobacco: Not on file  . Alcohol Use: Yes     Comment: 3 beers a week    Review of Systems  Constitutional: Positive for fever. Negative for chills.  HENT: Positive for congestion, postnasal drip, sinus pressure and sore throat. Negative for ear discharge, ear pain, facial swelling, rhinorrhea, trouble swallowing and voice change.   Eyes: Negative for discharge.  Respiratory: Negative for cough, shortness of breath, wheezing and stridor.   Cardiovascular: Negative for chest pain.  Gastrointestinal: Negative for nausea and vomiting.  Genitourinary: Negative.       Allergies   Codeine  Home Medications   Prior to Admission medications   Medication Sig Start Date End Date Taking? Authorizing Provider  amoxicillin (AMOXIL) 500 MG capsule Take 1 capsule (500 mg total) by mouth 3 (three) times daily. 05/25/14 06/04/14  Burgess Amor, PA-C  HYDROcodone-acetaminophen (NORCO/VICODIN) 5-325 MG per tablet Take 1 tablet by mouth every 4 (four) hours as needed for moderate pain. 05/25/14   Burgess Amor, PA-C   BP 138/85  Pulse 90  Temp(Src) 98.5 F (36.9 C) (Oral)  Resp 16  Ht 5\' 10"  (1.778 m)  Wt 201 lb (91.173 kg)  BMI 28.84 kg/m2  SpO2 97% Physical Exam  Constitutional: He is oriented to person, place, and time. He appears well-developed and well-nourished.  HENT:  Head: Normocephalic and atraumatic.  Right Ear: Tympanic membrane and ear canal normal.  Left Ear: Tympanic membrane and ear canal normal.  Nose: Mucosal edema present. No rhinorrhea. Right sinus exhibits frontal sinus tenderness. Left sinus exhibits frontal sinus tenderness.  Mouth/Throat: Uvula is midline, oropharynx is clear and moist and mucous membranes are normal. No oropharyngeal exudate, posterior oropharyngeal edema, posterior oropharyngeal erythema or tonsillar abscesses.  Eyes: Conjunctivae are normal.  Cardiovascular: Normal rate and normal heart sounds.   Pulmonary/Chest: Effort normal. No respiratory distress. He has no wheezes. He has no rales.  Abdominal: Soft. There is no tenderness.  Musculoskeletal: Normal range of motion.  Neurological: He is alert and oriented to person, place, and  time.  Skin: Skin is warm and dry. No rash noted.  Psychiatric: He has a normal mood and affect.    ED Course  Procedures (including critical care time) Labs Review Labs Reviewed - No data to display  Imaging Review No results found.   EKG Interpretation None      MDM   Final diagnoses:  Sinusitis, acute    Suspect early sinusitis.  Prescribed amoxil, hydrocodone.  Warm compresses, humidity,  continue with sudafed.  Prn f/u with pcp if sx not improving with tx.  The patient appears reasonably screened and/or stabilized for discharge and I doubt any other medical condition or other [facility] requiring further screening, evaluation, or treatment in the ED at this time prior to discharge.     Burgess AmorJulie Adell Panek, PA-C 05/27/14 [phone]

## 2014-05-28 NOTE — ED Provider Notes (Signed)
Medical screening examination/treatment/procedure(s) were performed by non-physician practitioner and as supervising physician I was immediately available for consultation/collaboration.   EKG Interpretation None        Laray Anger, DO 05/28/14 1204

## 2014-06-24 ENCOUNTER — Ambulatory Visit (INDEPENDENT_AMBULATORY_CARE_PROVIDER_SITE_OTHER): Payer: BC Managed Care – PPO | Admitting: Family Medicine

## 2014-06-24 ENCOUNTER — Encounter: Payer: Self-pay | Admitting: Family Medicine

## 2014-06-24 VITALS — BP 122/80 | Ht 70.0 in | Wt 197.0 lb

## 2014-06-24 DIAGNOSIS — R350 Frequency of micturition: Secondary | ICD-10-CM

## 2014-06-24 DIAGNOSIS — R7309 Other abnormal glucose: Secondary | ICD-10-CM

## 2014-06-24 DIAGNOSIS — R739 Hyperglycemia, unspecified: Secondary | ICD-10-CM

## 2014-06-24 DIAGNOSIS — N410 Acute prostatitis: Secondary | ICD-10-CM

## 2014-06-24 LAB — POCT URINALYSIS DIPSTICK
Glucose, UA: 150
PH UA: 5
Spec Grav, UA: 1.025

## 2014-06-24 LAB — POCT GLUCOSE (DEVICE FOR HOME USE): POC Glucose: 143 mg/dl — AB (ref 70–99)

## 2014-06-24 LAB — POCT GLYCOSYLATED HEMOGLOBIN (HGB A1C): HEMOGLOBIN A1C: 5.8

## 2014-06-24 MED ORDER — CIPROFLOXACIN HCL 750 MG PO TABS: 750.0000 mg | ORAL_TABLET | Freq: Two times a day (BID) | ORAL | Status: DC

## 2014-06-24 NOTE — Progress Notes (Signed)
   Subjective:    Patient ID: Derek MaduraCharles N Lager, male    DOB: 03-21-1971, 43 y.o.   MRN: [id number]  Urinary Frequency  The current episode started in the past 7 days. There has been no fever. Associated symptoms include frequency and urgency.   Constipation. Eating more fiber.  Blurry vision off and on for awhile. Blood sugar 143.   Coming on over the past week, gets up at night and doesn't urinate very well    Review of Systems  Genitourinary: Positive for urgency and frequency.   no fever no chills no vomiting ROS otherwise negative.     Objective:   Physical Exam  Alert no apparent distress H&T normal. Lungs clear heart rare rhythm. Prostate boggy tender to palpation.  Urinalysis 2-4 whites per high-powered field      Assessment & Plan:  Impression 1 acute prostatitis discussed. #2 hyperglycemia. A1c slightly elevated. Discussed. Plan Cipro twice a day 21 days. Yearly physical in a couple months. We'll do PSA been. Rationale discussed. Cut down sugars and diet. WSL

## 2014-08-08 ENCOUNTER — Encounter: Payer: Self-pay | Admitting: Family Medicine

## 2014-08-08 ENCOUNTER — Ambulatory Visit (INDEPENDENT_AMBULATORY_CARE_PROVIDER_SITE_OTHER): Payer: BC Managed Care – PPO | Admitting: Family Medicine

## 2014-08-08 VITALS — BP 118/88 | Ht 69.0 in | Wt 198.2 lb

## 2014-08-08 DIAGNOSIS — Z125 Encounter for screening for malignant neoplasm of prostate: Secondary | ICD-10-CM

## 2014-08-08 DIAGNOSIS — Z79899 Other long term (current) drug therapy: Secondary | ICD-10-CM

## 2014-08-08 DIAGNOSIS — Z0000 Encounter for general adult medical examination without abnormal findings: Secondary | ICD-10-CM

## 2014-08-08 NOTE — Progress Notes (Signed)
   Subjective:    Patient ID: Derek Jones, male    DOB: December 21, 1970, 43 y.o.   MRN: [id number]  HPI The patient comes in today for a wellness visit.    A review of their health history was completed.  A review of medications was also completed.  Any needed refills; n/a   Eating habits: good  Falls/  MVA accidents in past few months: none  Regular exercise: once a week  Specialist pt sees on regular basis: none  Preventative health issues were discussed.   Additional concerns: Patient states he has left foot pain that has been present for several months now.   startee working on exercise and twice per wk,  But very physical with work  Foot real sore, has to wear steel toes at work, uses the tennis shoe brand  Strong fam hx of prost cancer  Urinating has calmed down  Diet 6 out of ten  Review of Systems  All other systems reviewed and are negative.      Objective:   Physical Exam  Vitals reviewed. Constitutional: He appears well-developed and well-nourished.  HENT:  Head: Normocephalic and atraumatic.  Right Ear: External ear normal.  Left Ear: External ear normal.  Nose: Nose normal.  Mouth/Throat: Oropharynx is clear and moist.  Eyes: EOM are normal. Pupils are equal, round, and reactive to light.  Neck: Normal range of motion. Neck supple. No thyromegaly present.  Cardiovascular: Normal rate, regular rhythm and normal heart sounds.   No murmur heard. Pulmonary/Chest: Effort normal and breath sounds normal. No respiratory distress. He has no wheezes.  Abdominal: Soft. Bowel sounds are normal. He exhibits no distension and no mass. There is no tenderness.  Genitourinary: Penis normal.  Musculoskeletal: Normal range of motion. He exhibits no edema.  Lymphadenopathy:    He has no cervical adenopathy.  Neurological: He is alert. He exhibits normal muscle tone.  Skin: Skin is warm and dry. No erythema.  Psychiatric: He has a normal mood and affect. His  behavior is normal. Judgment normal.   Fibrosis left distal foot of plantar fascia. Noticeable nodule with tenderness to deep palpation       Assessment & Plan:  Impression 1 wellness exam #2 fibrosis plantar fascia discusses is worse and will eventually need to see a podiatrist plan diet exercise discussed in encourage. Appropriate blood work. Further recommendations based on results. WSL

## 2014-08-10 LAB — LIPID PANEL
Cholesterol: 151 mg/dL (ref 0–200)
HDL: 42 mg/dL (ref >39)
LDL Cholesterol: 72 mg/dL (ref 0–99)
TRIGLYCERIDES: 185 mg/dL — AB (ref <150)
Total CHOL/HDL Ratio: 3.6 Ratio
VLDL: 37 mg/dL (ref 0–40)

## 2014-08-10 LAB — BASIC METABOLIC PANEL
BUN: 11 mg/dL (ref 6–23)
CALCIUM: 9.3 mg/dL (ref 8.4–10.5)
CO2: 28 mEq/L (ref 19–32)
Chloride: 104 mEq/L (ref 96–112)
Creat: 1.02 mg/dL (ref 0.50–1.35)
GLUCOSE: 87 mg/dL (ref 70–99)
Potassium: 4.3 mEq/L (ref 3.5–5.3)
Sodium: 142 mEq/L (ref 135–145)

## 2014-08-10 LAB — HEPATIC FUNCTION PANEL
ALT: 23 U/L (ref 0–53)
AST: 27 U/L (ref 0–37)
Albumin: 4.4 g/dL (ref 3.5–5.2)
Alkaline Phosphatase: 68 U/L (ref 39–117)
BILIRUBIN INDIRECT: 0.3 mg/dL (ref 0.2–1.2)
Bilirubin, Direct: 0.1 mg/dL (ref 0.0–0.3)
Total Bilirubin: 0.4 mg/dL (ref 0.2–1.2)
Total Protein: 7 g/dL (ref 6.0–8.3)

## 2014-08-10 LAB — PSA: PSA: 1.98 ng/mL (ref <=4.00)

## 2014-08-15 ENCOUNTER — Encounter: Payer: Self-pay | Admitting: Family Medicine

## 2014-09-10 ENCOUNTER — Ambulatory Visit: Payer: BC Managed Care – PPO | Admitting: Orthopedic Surgery

## 2014-10-01 ENCOUNTER — Ambulatory Visit (HOSPITAL_COMMUNITY): Payer: BC Managed Care – PPO | Attending: Orthopedic Surgery | Admitting: Physical Therapy

## 2015-04-24 ENCOUNTER — Emergency Department (HOSPITAL_COMMUNITY)
Admission: EM | Admit: 2015-04-24 | Discharge: 2015-04-24 | Disposition: A | Discharge status: Home / Self Care | Payer: BLUE CROSS/BLUE SHIELD | Attending: Emergency Medicine | Admitting: Emergency Medicine

## 2015-04-24 ENCOUNTER — Encounter (HOSPITAL_COMMUNITY): Payer: Self-pay | Admitting: Emergency Medicine

## 2015-04-24 DIAGNOSIS — Y9389 Activity, other specified: Secondary | ICD-10-CM | POA: Diagnosis not present

## 2015-04-24 DIAGNOSIS — Y998 Other external cause status: Secondary | ICD-10-CM | POA: Insufficient documentation

## 2015-04-24 DIAGNOSIS — X58XXXA Exposure to other specified factors, initial encounter: Secondary | ICD-10-CM | POA: Diagnosis not present

## 2015-04-24 DIAGNOSIS — S3992XA Unspecified injury of lower back, initial encounter: Secondary | ICD-10-CM | POA: Diagnosis present

## 2015-04-24 DIAGNOSIS — Z87442 Personal history of urinary calculi: Secondary | ICD-10-CM | POA: Insufficient documentation

## 2015-04-24 DIAGNOSIS — S39012A Strain of muscle, fascia and tendon of lower back, initial encounter: Secondary | ICD-10-CM | POA: Insufficient documentation

## 2015-04-24 DIAGNOSIS — G8929 Other chronic pain: Secondary | ICD-10-CM | POA: Diagnosis not present

## 2015-04-24 DIAGNOSIS — Z87438 Personal history of other diseases of male genital organs: Secondary | ICD-10-CM | POA: Diagnosis not present

## 2015-04-24 DIAGNOSIS — Y9289 Other specified places as the place of occurrence of the external cause: Secondary | ICD-10-CM | POA: Diagnosis not present

## 2015-04-24 MED ORDER — PREDNISONE 50 MG PO TABS
60.0000 mg | ORAL_TABLET | Freq: Once | ORAL | Status: AC
  Administered 2015-04-24: 60 mg via ORAL
  Filled 2015-04-24 (×2): qty 1

## 2015-04-24 MED ORDER — ONDANSETRON HCL 4 MG PO TABS
4.0000 mg | ORAL_TABLET | Freq: Once | ORAL | Status: AC
  Administered 2015-04-24: 4 mg via ORAL
  Filled 2015-04-24: qty 1

## 2015-04-24 MED ORDER — KETOROLAC TROMETHAMINE 10 MG PO TABS
10.0000 mg | ORAL_TABLET | Freq: Once | ORAL | Status: AC
  Administered 2015-04-24: 10 mg via ORAL
  Filled 2015-04-24: qty 1

## 2015-04-24 MED ORDER — METHOCARBAMOL 500 MG PO TABS: 500.0000 mg | ORAL_TABLET | Freq: Three times a day (TID) | ORAL | Status: DC

## 2015-04-24 MED ORDER — CELECOXIB 100 MG PO CAPS: 100.0000 mg | ORAL_CAPSULE | Freq: Two times a day (BID) | ORAL | Status: DC

## 2015-04-24 MED ORDER — DEXAMETHASONE 6 MG PO TABS: ORAL_TABLET | ORAL | Status: DC

## 2015-04-24 NOTE — ED Provider Notes (Signed)
CSN: [id number]     Arrival date & time 04/24/15  2114 History   First MD Initiated Contact with Patient 04/24/15 2142     Chief Complaint  Patient presents with  . Back Pain     (Consider location/radiation/quality/duration/timing/severity/associated sxs/prior Treatment) Patient is a 44 y.o. male presenting with back pain. The history is provided by the patient.  Back Pain Location:  Lumbar spine Quality:  Aching Pain severity:  Moderate Pain is:  Same all the time Onset quality:  Gradual Duration:  1 week Timing:  Intermittent Progression:  Worsening Chronicity:  Chronic Context: lifting heavy objects   Relieved by:  Nothing Worsened by:  Movement Ineffective treatments:  Lying down Associated symptoms: no bladder incontinence, no bowel incontinence and no perianal numbness   Risk factors: no lack of exercise and no recent surgery     Past Medical History  Diagnosis Date  . ED (erectile dysfunction)   . Low back pain   . Kidney stone   . Chronic neck pain   . Chronic knee pain    Past Surgical History  Procedure Laterality Date  . Shoulder surgery     Family History  Problem Relation Age of Onset  . Arthritis    . Cancer    . Diabetes Mother   . Stroke Maternal Grandmother   . Hypertension Maternal Grandmother    History  Substance Use Topics  . Smoking status: Never Smoker   . Smokeless tobacco: Not on file  . Alcohol Use: Yes     Comment: 3 beers a week    Review of Systems  Gastrointestinal: Negative for bowel incontinence.  Genitourinary: Negative for bladder incontinence.  Musculoskeletal: Positive for back pain and arthralgias.  All other systems reviewed and are negative.     Allergies  Codeine  Home Medications   Prior to Admission medications   Not on File   BP 137/97 mmHg  Pulse 71  Temp(Src) 98.3 F (36.8 C) (Oral)  Resp 20  Ht 5\' 10"  (1.778 m)  Wt 200 lb (90.719 kg)  BMI 28.70 kg/m2  SpO2 100% Physical Exam    Constitutional: He is oriented to person, place, and time. He appears well-developed and well-nourished.  Non-toxic appearance.  HENT:  Head: Normocephalic.  Right Ear: Tympanic membrane and external ear normal.  Left Ear: Tympanic membrane and external ear normal.  Eyes: EOM and lids are normal. Pupils are equal, round, and reactive to light.  Neck: Normal range of motion. Neck supple. Carotid bruit is not present.  Cardiovascular: Normal rate, regular rhythm, normal heart sounds, intact distal pulses and normal pulses.   Pulmonary/Chest: Breath sounds normal. No respiratory distress.  Abdominal: Soft. Bowel sounds are normal. There is no tenderness. There is no guarding.  Musculoskeletal:       Lumbar back: He exhibits decreased range of motion, pain and spasm. He exhibits no deformity.       Back:  Lymphadenopathy:       Head (right side): No submandibular adenopathy present.       Head (left side): No submandibular adenopathy present.    He has no cervical adenopathy.  Neurological: He is alert and oriented to person, place, and time. He has normal strength. No cranial nerve deficit or sensory deficit.  Skin: Skin is warm and dry.  Psychiatric: He has a normal mood and affect. His speech is normal.  Nursing note and vitals reviewed.   ED Course  Procedures (including critical care time) Labs  Review Labs Reviewed - No data to display  Imaging Review No results found.   EKG Interpretation None      MDM  Diastolic blood pressure slightly elevated at 97, otherwise the vital signs are well within normal limits. No gross neurologic deficits appreciated. Gait is intact. Suspect the patient has a lumbar strain. The patient is given a few days off from work and asked to use a heating pad to his lower back. Prescription for Celebrex, Decadron, and Robaxin given to the patient. The patient will follow-up with Dr. Luiz BlareGraves for additional evaluation and management if not improving.     Final diagnoses:  None    **I have reviewed nursing notes, vital signs, and all appropriate lab and imaging results for this patient.Ivery Quale*    Olive Motyka, PA-C 04/24/15 [id number]  Margarita Grizzleanielle Ray, MD 04/24/15 2228

## 2015-04-24 NOTE — ED Notes (Signed)
Pt c/o lower back pain x one week and denies any injury.

## 2015-04-24 NOTE — Discharge Instructions (Signed)
Please rest your back is much as possible. A please use a heating pad to your lower back. Please use medications as prescribed. Robaxin may cause drowsiness, please use with caution. Please see Dr. Luiz BlareGraves or a member of his team for additional evaluation if not improving. Back Pain, Adult Low back pain is very common. About 1 in 5 people have back pain.The cause of low back pain is rarely dangerous. The pain often gets better over time.About half of people with a sudden onset of back pain feel better in just 2 weeks. About 8 in 10 people feel better by 6 weeks.  CAUSES Some common causes of back pain include:  Strain of the muscles or ligaments supporting the spine.  Wear and tear (degeneration) of the spinal discs.  Arthritis.  Direct injury to the back. DIAGNOSIS Most of the time, the direct cause of low back pain is not known.However, back pain can be treated effectively even when the exact cause of the pain is unknown.Answering your caregiver's questions about your overall health and symptoms is one of the most accurate ways to make sure the cause of your pain is not dangerous. If your caregiver needs more information, he or she may order lab work or imaging tests (X-rays or MRIs).However, even if imaging tests show changes in your back, this usually does not require surgery. HOME CARE INSTRUCTIONS For many people, back pain returns.Since low back pain is rarely dangerous, it is often a condition that people can learn to [facility] their own.   Remain active. It is stressful on the back to sit or stand in one place. Do not sit, drive, or stand in one place for more than 30 minutes at a time. Take short walks on level surfaces as soon as pain allows.Try to increase the length of time you walk each day.  Do not stay in bed.Resting more than 1 or 2 days can delay your recovery.  Do not avoid exercise or work.Your body is made to move.It is not dangerous to be active, even though your  back may hurt.Your back will likely heal faster if you return to being active before your pain is gone.  Pay attention to your body when you bend and lift. Many people have less discomfortwhen lifting if they bend their knees, keep the load close to their bodies,and avoid twisting. Often, the most comfortable positions are those that put less stress on your recovering back.  Find a comfortable position to sleep. Use a firm mattress and lie on your side with your knees slightly bent. If you lie on your back, put a pillow under your knees.  Only take over-the-counter or prescription medicines as directed by your caregiver. Over-the-counter medicines to reduce pain and inflammation are often the most helpful.Your caregiver may prescribe muscle relaxant drugs.These medicines help dull your pain so you can more quickly return to your normal activities and healthy exercise.  Put ice on the injured area.  Put ice in a plastic bag.  Place a towel between your skin and the bag.  Leave the ice on for 15-20 minutes, 03-04 times a day for the first 2 to 3 days. After that, ice and heat may be alternated to reduce pain and spasms.  Ask your caregiver about trying back exercises and gentle massage. This may be of some benefit.  Avoid feeling anxious or stressed.Stress increases muscle tension and can worsen back pain.It is important to recognize when you are anxious or stressed and learn ways  to manage it.Exercise is a great option. SEEK MEDICAL CARE IF:  You have pain that is not relieved with rest or medicine.  You have pain that does not improve in 1 week.  You have new symptoms.  You are generally not feeling well. SEEK IMMEDIATE MEDICAL CARE IF:   You have pain that radiates from your back into your legs.  You develop new bowel or bladder control problems.  You have unusual weakness or numbness in your arms or legs.  You develop nausea or vomiting.  You develop abdominal  pain.  You feel faint. Document Released: 12/06/2005 Document Revised: 06/06/2012 Document Reviewed: 04/09/2014 [facility] Patient Information 2015 [place], [place]. This information is not intended to replace advice given to you by your health care provider. Make sure you discuss any questions you have with your health care provider.

## 2016-01-01 ENCOUNTER — Encounter (HOSPITAL_COMMUNITY): Payer: Self-pay | Admitting: Emergency Medicine

## 2016-01-01 ENCOUNTER — Emergency Department (HOSPITAL_COMMUNITY)
Admission: EM | Admit: 2016-01-01 | Discharge: 2016-01-02 | Disposition: A | Discharge status: Home / Self Care | Payer: BLUE CROSS/BLUE SHIELD | Attending: Emergency Medicine | Admitting: Emergency Medicine

## 2016-01-01 DIAGNOSIS — G8929 Other chronic pain: Secondary | ICD-10-CM | POA: Insufficient documentation

## 2016-01-01 DIAGNOSIS — Z87438 Personal history of other diseases of male genital organs: Secondary | ICD-10-CM | POA: Diagnosis not present

## 2016-01-01 DIAGNOSIS — R079 Chest pain, unspecified: Secondary | ICD-10-CM | POA: Insufficient documentation

## 2016-01-01 DIAGNOSIS — Z87442 Personal history of urinary calculi: Secondary | ICD-10-CM | POA: Diagnosis not present

## 2016-01-01 DIAGNOSIS — Z79899 Other long term (current) drug therapy: Secondary | ICD-10-CM | POA: Diagnosis not present

## 2016-01-01 DIAGNOSIS — Z791 Long term (current) use of non-steroidal anti-inflammatories (NSAID): Secondary | ICD-10-CM | POA: Diagnosis not present

## 2016-01-01 NOTE — ED Notes (Signed)
Pt had dull chest pain all day, tonight sharp left chest pain, left arm numbness, hot and clammy, sob

## 2016-01-02 ENCOUNTER — Emergency Department (HOSPITAL_COMMUNITY): Payer: BLUE CROSS/BLUE SHIELD

## 2016-01-02 LAB — BASIC METABOLIC PANEL
Anion gap: 7 (ref 5–15)
BUN: 17 mg/dL (ref 6–20)
CHLORIDE: 104 mmol/L (ref 101–111)
CO2: 26 mmol/L (ref 22–32)
CREATININE: 1.18 mg/dL (ref 0.61–1.24)
Calcium: 8.8 mg/dL — ABNORMAL LOW (ref 8.9–10.3)
GFR calc non Af Amer: >60 mL/min (ref >60)
Glucose, Bld: 150 mg/dL — ABNORMAL HIGH (ref 65–99)
POTASSIUM: 3.6 mmol/L (ref 3.5–5.1)
SODIUM: 137 mmol/L (ref 135–145)

## 2016-01-02 LAB — CBC
HEMATOCRIT: 40.1 % (ref 39.0–52.0)
Hemoglobin: 13.8 g/dL (ref 13.0–17.0)
MCH: 30.5 pg (ref 26.0–34.0)
MCHC: 34.4 g/dL (ref 30.0–36.0)
MCV: 88.5 fL (ref 78.0–100.0)
PLATELETS: 195 10*3/uL (ref 150–400)
RBC: 4.53 MIL/uL (ref 4.22–5.81)
RDW: 13.2 % (ref 11.5–15.5)
WBC: 6.4 10*3/uL (ref 4.0–10.5)

## 2016-01-02 LAB — TROPONIN I
Troponin I: <0.03 ng/mL (ref <0.031)
Troponin I: <0.03 ng/mL (ref <0.031)

## 2016-01-02 NOTE — Discharge Instructions (Signed)
Nonspecific Chest Pain  °Chest pain can be caused by many different conditions. There is always a chance that your pain could be related to something serious, such as a heart attack or a blood clot in your lungs. Chest pain can also be caused by conditions that are not life-threatening. If you have chest pain, it is very important to follow up with your health care provider. °CAUSES  °Chest pain can be caused by: °· Heartburn. °· Pneumonia or bronchitis. °· Anxiety or stress. °· Inflammation around your heart (pericarditis) or lung (pleuritis or pleurisy). °· A blood clot in your lung. °· A collapsed lung (pneumothorax). It can develop suddenly on its own (spontaneous pneumothorax) or from trauma to the chest. °· Shingles infection (varicella-zoster virus). °· Heart attack. °· Damage to the bones, muscles, and cartilage that make up your chest wall. This can include: °¨ Bruised bones due to injury. °¨ Strained muscles or cartilage due to frequent or repeated coughing or overwork. °¨ Fracture to one or more ribs. °¨ Sore cartilage due to inflammation (costochondritis). °RISK FACTORS  °Risk factors for chest pain may include: °· Activities that increase your risk for trauma or injury to your chest. °· Respiratory infections or conditions that cause frequent coughing. °· Medical conditions or overeating that can cause heartburn. °· Heart disease or family history of heart disease. °· Conditions or health behaviors that increase your risk of developing a blood clot. °· Having had chicken pox (varicella zoster). °SIGNS AND SYMPTOMS °Chest pain can feel like: °· Burning or tingling on the surface of your chest or deep in your chest. °· Crushing, pressure, aching, or squeezing pain. °· Dull or sharp pain that is worse when you move, cough, or take a deep breath. °· Pain that is also felt in your back, neck, shoulder, or arm, or pain that spreads to any of these areas. °Your chest pain may come and go, or it may stay  constant. °DIAGNOSIS °Lab tests or other studies may be needed to find the cause of your pain. Your health care provider may have you take a test called an ambulatory ECG (electrocardiogram). An ECG records your heartbeat patterns at the time the test is performed. You may also have other tests, such as: °· Transthoracic echocardiogram (TTE). During echocardiography, sound waves are used to create a picture of all of the heart structures and to look at how blood flows through your heart. °· Transesophageal echocardiogram (TEE). This is a more advanced imaging test that obtains images from inside your body. It allows your health care provider to see your heart in finer detail. °· Cardiac monitoring. This allows your health care provider to monitor your heart rate and rhythm in real time. °· Holter monitor. This is a portable device that records your heartbeat and can help to diagnose abnormal heartbeats. It allows your health care provider to track your heart activity for several days, if needed. °· Stress tests. These can be done through exercise or by taking medicine that makes your heart beat more quickly. °· Blood tests. °· Imaging tests. °TREATMENT  °Your treatment depends on what is causing your chest pain. Treatment may include: °· Medicines. These may include: °¨ Acid blockers for heartburn. °¨ Anti-inflammatory medicine. °¨ Pain medicine for inflammatory conditions. °¨ Antibiotic medicine, if an infection is present. °¨ Medicines to dissolve blood clots. °¨ Medicines to treat coronary artery disease. °· Supportive care for conditions that do not require medicines. This may include: °¨ Resting. °¨ Applying heat   or cold packs to injured areas. °¨ Limiting activities until pain decreases. °HOME CARE INSTRUCTIONS °· If you were prescribed an antibiotic medicine, finish it all even if you start to feel better. °· Avoid any activities that bring on chest pain. °· Do not use any tobacco products, including  cigarettes, chewing tobacco, or electronic cigarettes. If you need help quitting, ask your health care provider. °· Do not drink alcohol. °· Take medicines only as directed by your health care provider. °· Keep all follow-up visits as directed by your health care provider. This is important. This includes any further testing if your chest pain does not go away. °· If heartburn is the cause for your chest pain, you may be told to keep your head raised (elevated) while sleeping. This reduces the chance that acid will go from your stomach into your esophagus. °· Make lifestyle changes as directed by your health care provider. These may include: °¨ Getting regular exercise. Ask your health care provider to suggest some activities that are safe for you. °¨ Eating a heart-healthy diet. A registered dietitian can help you to learn healthy eating options. °¨ Maintaining a healthy weight. °¨ Managing diabetes, if necessary. °¨ Reducing stress. °SEEK MEDICAL CARE IF: °· Your chest pain does not go away after treatment. °· You have a rash with blisters on your chest. °· You have a fever. °SEEK IMMEDIATE MEDICAL CARE IF:  °· Your chest pain is worse. °· You have an increasing cough, or you cough up blood. °· You have severe abdominal pain. °· You have severe weakness. °· You faint. °· You have chills. °· You have sudden, unexplained chest discomfort. °· You have sudden, unexplained discomfort in your arms, back, neck, or jaw. °· You have shortness of breath at any time. °· You suddenly start to sweat, or your skin gets clammy. °· You feel nauseous or you vomit. °· You suddenly feel light-headed or dizzy. °· Your heart begins to beat quickly, or it feels like it is skipping beats. °These symptoms may represent a serious problem that is an emergency. Do not wait to see if the symptoms will go away. Get medical help right away. Call your local emergency services (911 in the [place] Do not drive yourself to the hospital. °  °This  information is not intended to replace advice given to you by your health care provider. Make sure you discuss any questions you have with your health care provider. °  °Document Released: 09/15/2005 Document Revised: 12/27/2014 Document Reviewed: 07/12/2014 °Elsevier Interactive Patient Education ©2016 Elsevier Inc[employer] ° °

## 2016-01-02 NOTE — ED Provider Notes (Signed)
TIME SEEN: 12:15 AM  CHIEF COMPLAINT: Chest pain  HPI: Pt is a 45 y.o. male with history of chronic neck and knee pain, kidney stones who presents to the emergency department complaints of intermittent left-sided sharp chest pain that radiates into the lower part of his neck for 1 day. Pain started at 10 AM today. States it started worse tonight after he was vacuuming his floor and using a powder on the carpet. He thinks that he may have "breathed it in". States when he went outside and took a deep breath and had fresh air his pain improved. States he did have some shortness of breath, felt hot and clammy. He denies any arm numbness to me despite nursing notes. Denies diaphoresis, dizziness. No nausea, vomiting. Denies having any pain or shortness of breath currently. No history of stress test, catheterization. No history of hypertension, diabetes, hyperlipidemia or tobacco use. His mother had a history of CHF and died in her late 2s. No family history of premature CAD.  No history of PE, DVT, recent fracture, surgery, trauma, hospitalization, prolonged travel. No lower extremity swelling or pain. No calf tenderness. No fever or cough.   ROS: See HPI Constitutional: no fever  Eyes: no drainage  ENT: no runny nose   Cardiovascular:   chest pain  Resp:  SOB  GI: no vomiting GU: no dysuria Integumentary: no rash  Allergy: no hives  Musculoskeletal: no leg swelling  Neurological: no slurred speech ROS otherwise negative  PAST MEDICAL HISTORY/PAST SURGICAL HISTORY:  Past Medical History  Diagnosis Date  . ED (erectile dysfunction)   . Low back pain   . Kidney stone   . Chronic neck pain   . Chronic knee pain     MEDICATIONS:  Prior to Admission medications   Medication Sig Start Date End Date Taking? Authorizing Provider  celecoxib (CELEBREX) 100 MG capsule Take 1 capsule (100 mg total) by mouth 2 (two) times daily. 04/24/15   Ivery Quale, PA-C  dexamethasone (DECADRON) 6 MG tablet 1  po bid with food 04/24/15   Ivery Quale, PA-C  methocarbamol (ROBAXIN) 500 MG tablet Take 1 tablet (500 mg total) by mouth 3 (three) times daily. 04/24/15   Ivery Quale, PA-C    ALLERGIES:  Allergies  Allergen Reactions  . Codeine Nausea Only    SOCIAL HISTORY:  Social History  Substance Use Topics  . Smoking status: Never Smoker   . Smokeless tobacco: Not on file  . Alcohol Use: Yes     Comment: 3 beers a week    FAMILY HISTORY: Family History  Problem Relation Age of Onset  . Arthritis    . Cancer    . Diabetes Mother   . Stroke Maternal Grandmother   . Hypertension Maternal Grandmother     EXAM: BP 125/85 mmHg  Pulse 77  Temp(Src) 98.4 F (36.9 C) (Oral)  Resp 15  Ht 5\' 10"  (1.778 m)  Wt 214 lb (97.07 kg)  BMI 30.71 kg/m2  SpO2 98% CONSTITUTIONAL: Alert and oriented and responds appropriately to questions. Well-appearing; well-nourished HEAD: Normocephalic EYES: Conjunctivae clear, PERRL ENT: normal nose; no rhinorrhea; moist mucous membranes; pharynx without lesions noted NECK: Supple, no meningismus, no LAD  CARD: RRR; S1 and S2 appreciated; no murmurs, no clicks, no rubs, no gallops CHEST:  Chest wall is nontender to palpation without crepitus, ecchymosis or deformity RESP: Normal chest excursion without splinting or tachypnea; breath sounds clear and equal bilaterally; no wheezes, no rhonchi, no rales, no hypoxia  or respiratory distress, speaking full sentences ABD/GI: Normal bowel sounds; non-distended; soft, non-tender, no rebound, no guarding, no peritoneal signs BACK:  The back appears normal and is non-tender to palpation, there is no CVA tenderness EXT: Normal ROM in all joints; non-tender to palpation; no edema; normal capillary refill; no cyanosis, no calf tenderness or swelling    SKIN: Normal color for age and race; warm NEURO: Moves all extremities equally, sensation to light touch intact diffusely, cranial nerves II through XII intact PSYCH: The  patient's mood and manner are appropriate. Grooming and personal hygiene are appropriate.  MEDICAL DECISION MAKING: Patient here with atypical chest pain. He is hemodynamically stable and currently symptomatic. Doubt pulmonary embolus given he has no risk factors for the same, doubt dissection given he is pain-free. Low suspicion for ACS but he does state that he does not see a doctor regularly. EKG shows no ischemic changes, arrhythmia or interval abnormality. We'll obtain cardiac labs, chest x-ray. Age and is currently on the cardiac monitor. Anticipate if he has 2 sets of negative cardiac enzymes that we can discharge him home with outpatient cardiology follow-up, outpatient PCP follow-up. HEART score is 1-2.    ED PROGRESS: 1:15 AM  Pt's labs including troponin negative. Chest x-ray clear. He is still asymptomatic and resting comfortably. Plan is to repeat troponin around 3 AM. If negative will discharge patient home with outpatient follow-up. He is comfortable with this plan.   3:35 AM  Pt's second troponin is negative. He is still asymptomatic. I feel he is safe to be discharged home and follow-up as an outpatient. Have provided him with PCP information as well as cardiology. Discussed return precautions. He verbalizes understanding and is comfortable with this plan.     EKG Interpretation  Date/Time:  Thursday January 01 2016 23:41:16 EST Ventricular Rate:  83 PR Interval:  159 QRS Duration: 88 QT Interval:  379 QTC Calculation: 445 R Axis:   43 Text Interpretation:  Sinus rhythm Baseline wander in lead(s) II III aVF V1 No old tracing to compare Confirmed by WARD,  DO, KRISTEN [phone] on 01/02/2016 12:14:44 AM         Layla MawKristen N Ward, DO 01/02/16 [id number]

## 2016-01-10 ENCOUNTER — Emergency Department (HOSPITAL_COMMUNITY)
Admission: EM | Admit: 2016-01-10 | Discharge: 2016-01-10 | Disposition: A | Discharge status: Home / Self Care | Payer: BLUE CROSS/BLUE SHIELD | Attending: Emergency Medicine | Admitting: Emergency Medicine

## 2016-01-10 ENCOUNTER — Emergency Department (HOSPITAL_COMMUNITY): Payer: BLUE CROSS/BLUE SHIELD

## 2016-01-10 ENCOUNTER — Encounter (HOSPITAL_COMMUNITY): Payer: Self-pay | Admitting: Emergency Medicine

## 2016-01-10 DIAGNOSIS — S4992XA Unspecified injury of left shoulder and upper arm, initial encounter: Secondary | ICD-10-CM | POA: Diagnosis present

## 2016-01-10 DIAGNOSIS — Y92410 Unspecified street and highway as the place of occurrence of the external cause: Secondary | ICD-10-CM | POA: Insufficient documentation

## 2016-01-10 DIAGNOSIS — Y998 Other external cause status: Secondary | ICD-10-CM | POA: Insufficient documentation

## 2016-01-10 DIAGNOSIS — Z87438 Personal history of other diseases of male genital organs: Secondary | ICD-10-CM | POA: Diagnosis not present

## 2016-01-10 DIAGNOSIS — Z87442 Personal history of urinary calculi: Secondary | ICD-10-CM | POA: Insufficient documentation

## 2016-01-10 DIAGNOSIS — S42032A Displaced fracture of lateral end of left clavicle, initial encounter for closed fracture: Secondary | ICD-10-CM | POA: Insufficient documentation

## 2016-01-10 DIAGNOSIS — Z791 Long term (current) use of non-steroidal anti-inflammatories (NSAID): Secondary | ICD-10-CM | POA: Diagnosis not present

## 2016-01-10 DIAGNOSIS — S42002A Fracture of unspecified part of left clavicle, initial encounter for closed fracture: Secondary | ICD-10-CM

## 2016-01-10 DIAGNOSIS — G8929 Other chronic pain: Secondary | ICD-10-CM | POA: Diagnosis not present

## 2016-01-10 DIAGNOSIS — Y9389 Activity, other specified: Secondary | ICD-10-CM | POA: Insufficient documentation

## 2016-01-10 DIAGNOSIS — Z79899 Other long term (current) drug therapy: Secondary | ICD-10-CM | POA: Diagnosis not present

## 2016-01-10 DIAGNOSIS — S161XXA Strain of muscle, fascia and tendon at neck level, initial encounter: Secondary | ICD-10-CM | POA: Diagnosis not present

## 2016-01-10 MED ORDER — OXYCODONE-ACETAMINOPHEN 5-325 MG PO TABS
1.0000 | ORAL_TABLET | Freq: Once | ORAL | Status: AC
  Administered 2016-01-10: 1 via ORAL
  Filled 2016-01-10: qty 1

## 2016-01-10 MED ORDER — OXYCODONE-ACETAMINOPHEN 5-325 MG PO TABS: 1.0000 | ORAL_TABLET | ORAL | Status: DC | PRN
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
## 2016-01-10 NOTE — Discharge Instructions (Signed)
Cervical Sprain A cervical sprain is when the tissues (ligaments) that hold the neck bones in place stretch or tear. HOME CARE   Put ice on the injured area.  Put ice in a plastic bag.  Place a towel between your skin and the bag.  Leave the ice on for 15-20 minutes, 3-4 times a day.  You may have been given a collar to wear. This collar keeps your neck from moving while you heal.  Do not take the collar off unless told by your doctor.  If you have long hair, keep it outside of the collar.  Ask your doctor before changing the position of your collar. You may need to change its position over time to make it more comfortable.  If you are allowed to take off the collar for cleaning or bathing, follow your doctor's instructions on how to do it safely.  Keep your collar clean by wiping it with mild soap and water. Dry it completely. If the collar has removable pads, remove them every 1-2 days to hand wash them with soap and water. Allow them to air dry. They should be dry before you wear them in the collar.  Do not drive while wearing the collar.  Only take medicine as told by your doctor.  Keep all doctor visits as told.  Keep all physical therapy visits as told.  Adjust your work station so that you have good posture while you work.  Avoid positions and activities that make your problems worse.  Warm up and stretch before being active. GET HELP IF:  Your pain is not controlled with medicine.  You cannot take less pain medicine over time as planned.  Your activity level does not improve as expected. GET HELP RIGHT AWAY IF:   You are bleeding.  Your stomach is upset.  You have an allergic reaction to your medicine.  You develop new problems that you cannot explain.  You lose feeling (become numb) or you cannot move any part of your body (paralysis).  You have tingling or weakness in any part of your body.  Your symptoms get worse. Symptoms include:  Pain,  soreness, stiffness, puffiness (swelling), or a burning feeling in your neck.  Pain when your neck is touched.  Shoulder or upper back pain.  Limited ability to move your neck.  Headache.  Dizziness.  Your hands or arms feel week, lose feeling, or tingle.  Muscle spasms.  Difficulty swallowing or chewing. MAKE SURE YOU:   Understand these instructions.  Will watch your condition.  Will get help right away if you are not doing well or get worse.   This information is not intended to replace advice given to you by your health care provider. Make sure you discuss any questions you have with your health care provider.   Document Released: 05/24/2008 Document Revised: 08/08/2013 Document Reviewed: 06/13/2013 Elsevier Interactive Patient Education 2016 Else[employer] Inc[employer]  Clavicle Fracture A clavicle fracture is a broken collarbone. The collarbone is the long bone that connects your shoulder to your rib cage. A broken collarbone may be treated with a sling, a wrap, or surgery. Treatment depends on whether the broken ends of the bone are out of place or not. HOME CARE  Put ice on the injured area:  Put ice in a plastic bag.  Place a towel between your skin and the bag.  Leave the ice on for 20 minutes, 2-3 times a day.  If you have a wrap or splint:  Wear it all the time, and remove it only to take a bath or shower.  When you bathe or shower, keep your shoulder in the same place as when the sling or wrap is on.  Do not lift your arm.  If you have a wrap:  Another person must tighten it every day.  It should be tight enough to hold your shoulders back.  Make sure you have enough room to put your pointer finger between your body and the strap.  Loosen the wrap right away if you cannot feel your arm or your hands tingle.  Only take medicines as told by your doctor.  Avoid activities that make the injury or pain worse for 4-6 weeks after surgery.  Keep all follow-up  appointments. GET HELP IF:  Your medicine is not making you feel less pain.  Your medicine is not making swelling better. GET HELP RIGHT AWAY IF:   Your cannot feel your arm.  Your arm is cold.  Your arm is a lighter color than normal. MAKE SURE YOU:   Understand these instructions.  Will watch your condition.  Will get help right away if you are not doing well or get worse.   This information is not intended to replace advice given to you by your health care provider. Make sure you discuss any questions you have with your health care provider.   Document Released: 05/24/2008 Document Revised: 12/11/2013 Document Reviewed: 10/29/2013 Elsevier Interactive Patient Education [employer]

## 2016-01-12 ENCOUNTER — Telehealth: Payer: Self-pay | Admitting: Orthopedic Surgery
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
## 2016-01-12 NOTE — Telephone Encounter (Signed)
Let me see if I can help  Was i on call? yes  ER follow up yes  I was on call yes  So the answer is follow up with me

## 2016-01-13 ENCOUNTER — Ambulatory Visit (INDEPENDENT_AMBULATORY_CARE_PROVIDER_SITE_OTHER): Payer: BLUE CROSS/BLUE SHIELD

## 2016-01-13 ENCOUNTER — Ambulatory Visit: Payer: BLUE CROSS/BLUE SHIELD

## 2016-01-13 ENCOUNTER — Encounter: Payer: Self-pay | Admitting: Orthopedic Surgery

## 2016-01-13 ENCOUNTER — Ambulatory Visit (INDEPENDENT_AMBULATORY_CARE_PROVIDER_SITE_OTHER): Payer: BLUE CROSS/BLUE SHIELD | Admitting: Orthopedic Surgery

## 2016-01-13 VITALS — BP 125/106 | Ht 70.0 in | Wt 214.0 lb

## 2016-01-13 DIAGNOSIS — M25532 Pain in left wrist: Secondary | ICD-10-CM

## 2016-01-13 DIAGNOSIS — S42032A Displaced fracture of lateral end of left clavicle, initial encounter for closed fracture: Secondary | ICD-10-CM | POA: Diagnosis not present

## 2016-01-13 MED ORDER — IBUPROFEN 800 MG PO TABS: 800.0000 mg | ORAL_TABLET | Freq: Three times a day (TID) | ORAL | Status: DC | PRN

## 2016-01-13 MED ORDER — OXYCODONE-ACETAMINOPHEN 5-325 MG PO TABS: 1.0000 | ORAL_TABLET | Freq: Four times a day (QID) | ORAL | Status: DC | PRN

## 2016-01-13 NOTE — Telephone Encounter (Signed)
Called back to patient; scheduled in cancellation slot for today, 01/13/16.  Patient aware.
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
## 2016-01-13 NOTE — Patient Instructions (Signed)
Out of work 4 weeks 

## 2016-02-09 ENCOUNTER — Encounter: Payer: Self-pay | Admitting: Orthopedic Surgery

## 2016-02-09 ENCOUNTER — Ambulatory Visit (INDEPENDENT_AMBULATORY_CARE_PROVIDER_SITE_OTHER): Payer: BLUE CROSS/BLUE SHIELD | Admitting: Orthopedic Surgery

## 2016-02-09 ENCOUNTER — Ambulatory Visit (INDEPENDENT_AMBULATORY_CARE_PROVIDER_SITE_OTHER): Payer: BLUE CROSS/BLUE SHIELD

## 2016-02-09 VITALS — BP 129/90 | Ht 70.0 in | Wt 213.0 lb

## 2016-02-09 DIAGNOSIS — S42002D Fracture of unspecified part of left clavicle, subsequent encounter for fracture with routine healing: Secondary | ICD-10-CM

## 2016-02-09 DIAGNOSIS — M5010 Cervical disc disorder with radiculopathy, unspecified cervical region: Secondary | ICD-10-CM

## 2016-02-09 MED ORDER — PREDNISONE 10 MG (48) PO TBPK: ORAL_TABLET | ORAL | Status: DC

## 2016-02-09 NOTE — Patient Instructions (Signed)
New medication sent to pharmacy
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
## 2016-02-23 ENCOUNTER — Encounter: Payer: Self-pay | Admitting: Orthopedic Surgery

## 2016-02-23 ENCOUNTER — Ambulatory Visit (INDEPENDENT_AMBULATORY_CARE_PROVIDER_SITE_OTHER): Payer: Self-pay | Admitting: Orthopedic Surgery

## 2016-02-23 VITALS — BP 121/85 | Ht 70.0 in | Wt 213.0 lb

## 2016-02-23 DIAGNOSIS — M5020 Other cervical disc displacement, unspecified cervical region: Secondary | ICD-10-CM

## 2016-02-23 NOTE — Patient Instructions (Addendum)
WE WILL SCHEDULE MRI FOR YOU AND CALL YOU WITH APPT  OUT OF WORK 2 WEEKS
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
## 2016-03-09 ENCOUNTER — Ambulatory Visit (HOSPITAL_COMMUNITY)
Admission: RE | Admit: 2016-03-09 | Discharge: 2016-03-09 | Disposition: A | Discharge status: Home / Self Care | Payer: BLUE CROSS/BLUE SHIELD | Source: Ambulatory Visit | Attending: Orthopedic Surgery | Admitting: Orthopedic Surgery

## 2016-03-09 DIAGNOSIS — M2578 Osteophyte, vertebrae: Secondary | ICD-10-CM | POA: Insufficient documentation

## 2016-03-09 DIAGNOSIS — M50223 Other cervical disc displacement at C6-C7 level: Secondary | ICD-10-CM | POA: Insufficient documentation

## 2016-03-09 DIAGNOSIS — M5020 Other cervical disc displacement, unspecified cervical region: Secondary | ICD-10-CM | POA: Diagnosis present

## 2016-03-09 DIAGNOSIS — M47892 Other spondylosis, cervical region: Secondary | ICD-10-CM | POA: Diagnosis not present

## 2016-03-12 ENCOUNTER — Ambulatory Visit (INDEPENDENT_AMBULATORY_CARE_PROVIDER_SITE_OTHER): Payer: BLUE CROSS/BLUE SHIELD | Admitting: Orthopedic Surgery

## 2016-03-12 ENCOUNTER — Encounter: Payer: Self-pay | Admitting: Orthopedic Surgery

## 2016-03-12 VITALS — BP 121/75 | Ht 72.0 in | Wt 211.0 lb

## 2016-03-12 DIAGNOSIS — M5020 Other cervical disc displacement, unspecified cervical region: Secondary | ICD-10-CM | POA: Diagnosis not present

## 2016-03-12 NOTE — Patient Instructions (Signed)
Referral to neurosurgery.

## 2016-03-12 NOTE — Progress Notes (Signed)
  Follow-up visit  Status post MRI cervical spine for weakness left upper extremity. The x-ray showed C5-6 interspace degenerative changes and MRI showed herniated disc  Review of systems he does have weakness and pain when he turns to the left  Physical Exam  Constitutional: He is oriented to person, place, and time. He appears well-developed and well-nourished. No distress.  Cardiovascular: Normal rate and intact distal pulses.   Neurological: He is alert and oriented to person, place, and time.  Skin: Skin is warm and dry. No rash noted. He is not diaphoretic. No erythema. No pallor.  Psychiatric: He has a normal mood and affect. His behavior is normal. Judgment and thought content normal.    His clinical exam shows that he does have tenderness in the cervical spine weakness in the biceps asymmetric biceps reflex, range of motion in the cervical spine is also diminished.  The MRI report is reviewed with the study and I agree with that report after my independent review that he has a herniated disc with foraminal stenosis at C5-C6  We recommend neurosurgical referral

## 2016-03-16 NOTE — Addendum Note (Signed)
Addended by: Diamantina MonksBOOTHE, Bradrick Kamau B on: 03/16/2016 06:07 PM   Modules accepted: Orders

## 2016-03-17 ENCOUNTER — Telehealth: Payer: Self-pay | Admitting: *Deleted

## 2016-03-17 NOTE — Telephone Encounter (Signed)
REFERRAL FAXED TO [facility] 

## 2016-03-30 NOTE — Telephone Encounter (Signed)
Appointment 03/31/16 9:30am [facility]

## 2016-03-31 DIAGNOSIS — M5412 Radiculopathy, cervical region: Secondary | ICD-10-CM | POA: Insufficient documentation

## 2016-04-13 ENCOUNTER — Telehealth: Payer: Self-pay | Admitting: *Deleted

## 2016-04-13 NOTE — Telephone Encounter (Signed)
[facility] 03/31/16

## 2016-09-01 ENCOUNTER — Telehealth: Payer: Self-pay | Admitting: Orthopedic Surgery

## 2016-09-01 NOTE — Telephone Encounter (Signed)
ROUTING TO DR HARRISON FOR APPROVAL 

## 2016-09-01 NOTE — Telephone Encounter (Signed)
Call received from patient inquiring about following up with Dr Romeo AppleHarrison for problem of continuing numbness of hand/upper extremity, which notes indicate were worked up and referral made to [facility].  I've called neurosurgery office and requested copy of reports; states patient was most recently seen there 04/26/16.  Please review and advise.  Patient's ph# [phone].

## 2016-09-02 NOTE — Telephone Encounter (Signed)
Please see response below

## 2016-09-02 NOTE — Telephone Encounter (Signed)
He should see them

## 2016-09-02 NOTE — Telephone Encounter (Signed)
Relayed to patient, per Dr Romeo AppleHarrison.

## 2016-09-23 DIAGNOSIS — M542 Cervicalgia: Secondary | ICD-10-CM | POA: Insufficient documentation

## 2016-09-23 DIAGNOSIS — M5412 Radiculopathy, cervical region: Secondary | ICD-10-CM | POA: Diagnosis not present

## 2016-10-08 DIAGNOSIS — M546 Pain in thoracic spine: Secondary | ICD-10-CM | POA: Diagnosis not present

## 2016-10-08 DIAGNOSIS — M5413 Radiculopathy, cervicothoracic region: Secondary | ICD-10-CM | POA: Diagnosis not present

## 2016-10-08 DIAGNOSIS — M9902 Segmental and somatic dysfunction of thoracic region: Secondary | ICD-10-CM | POA: Diagnosis not present

## 2016-10-08 DIAGNOSIS — M9901 Segmental and somatic dysfunction of cervical region: Secondary | ICD-10-CM | POA: Diagnosis not present

## 2016-10-11 DIAGNOSIS — M9902 Segmental and somatic dysfunction of thoracic region: Secondary | ICD-10-CM | POA: Diagnosis not present

## 2016-10-11 DIAGNOSIS — M546 Pain in thoracic spine: Secondary | ICD-10-CM | POA: Diagnosis not present

## 2016-10-11 DIAGNOSIS — M5413 Radiculopathy, cervicothoracic region: Secondary | ICD-10-CM | POA: Diagnosis not present

## 2016-10-11 DIAGNOSIS — M9901 Segmental and somatic dysfunction of cervical region: Secondary | ICD-10-CM | POA: Diagnosis not present

## 2016-10-15 DIAGNOSIS — M546 Pain in thoracic spine: Secondary | ICD-10-CM | POA: Diagnosis not present

## 2016-10-15 DIAGNOSIS — M5413 Radiculopathy, cervicothoracic region: Secondary | ICD-10-CM | POA: Diagnosis not present

## 2016-10-15 DIAGNOSIS — M9902 Segmental and somatic dysfunction of thoracic region: Secondary | ICD-10-CM | POA: Diagnosis not present

## 2016-10-15 DIAGNOSIS — M9901 Segmental and somatic dysfunction of cervical region: Secondary | ICD-10-CM | POA: Diagnosis not present

## 2016-10-20 DIAGNOSIS — M9901 Segmental and somatic dysfunction of cervical region: Secondary | ICD-10-CM | POA: Diagnosis not present

## 2016-10-20 DIAGNOSIS — M9902 Segmental and somatic dysfunction of thoracic region: Secondary | ICD-10-CM | POA: Diagnosis not present

## 2016-10-20 DIAGNOSIS — M5413 Radiculopathy, cervicothoracic region: Secondary | ICD-10-CM | POA: Diagnosis not present

## 2016-10-20 DIAGNOSIS — M546 Pain in thoracic spine: Secondary | ICD-10-CM | POA: Diagnosis not present

## 2016-10-25 DIAGNOSIS — M546 Pain in thoracic spine: Secondary | ICD-10-CM | POA: Diagnosis not present

## 2016-10-25 DIAGNOSIS — M9901 Segmental and somatic dysfunction of cervical region: Secondary | ICD-10-CM | POA: Diagnosis not present

## 2016-10-25 DIAGNOSIS — M5413 Radiculopathy, cervicothoracic region: Secondary | ICD-10-CM | POA: Diagnosis not present

## 2016-10-25 DIAGNOSIS — M9902 Segmental and somatic dysfunction of thoracic region: Secondary | ICD-10-CM | POA: Diagnosis not present

## 2016-11-03 DIAGNOSIS — M5413 Radiculopathy, cervicothoracic region: Secondary | ICD-10-CM | POA: Diagnosis not present

## 2016-11-03 DIAGNOSIS — M9901 Segmental and somatic dysfunction of cervical region: Secondary | ICD-10-CM | POA: Diagnosis not present

## 2016-11-03 DIAGNOSIS — M9902 Segmental and somatic dysfunction of thoracic region: Secondary | ICD-10-CM | POA: Diagnosis not present

## 2016-11-03 DIAGNOSIS — M546 Pain in thoracic spine: Secondary | ICD-10-CM | POA: Diagnosis not present

## 2016-11-05 ENCOUNTER — Encounter (HOSPITAL_COMMUNITY): Payer: Self-pay | Admitting: Emergency Medicine

## 2016-11-05 ENCOUNTER — Emergency Department (HOSPITAL_COMMUNITY)
Admission: EM | Admit: 2016-11-05 | Discharge: 2016-11-05 | Disposition: A | Discharge status: Home / Self Care | Payer: BLUE CROSS/BLUE SHIELD | Attending: Emergency Medicine | Admitting: Emergency Medicine

## 2016-11-05 DIAGNOSIS — J029 Acute pharyngitis, unspecified: Secondary | ICD-10-CM | POA: Insufficient documentation

## 2016-11-05 MED ORDER — LORATADINE 10 MG PO TABS: 10.0000 mg | ORAL_TABLET | Freq: Every day | ORAL | 0 refills | Status: DC

## 2016-11-05 MED ORDER — OMEPRAZOLE 20 MG PO CPDR: 20.0000 mg | DELAYED_RELEASE_CAPSULE | Freq: Every day | ORAL | 0 refills | Status: DC

## 2016-11-05 NOTE — Discharge Instructions (Signed)
You were seen in the ED today with sore throat. We are restarting your acid reflux medication and starting you on Claritin, an allergy medication. We are providing the contact information for an ENT physician to use if symptoms do not resolve with conservative measures at home.   Return to the ED with any sudden difficulty breathing, fever, chills, and voice changes.

## 2016-11-05 NOTE — ED Triage Notes (Signed)
PT states he feels like it's something draining in the back of his throat on the left side and feels like its a tight feeling x 1 month. PT denies any fever, cough, nasal congestion. PT also states hx of GERD but no current treatment and his throat feels worse at night when he lays down.

## 2016-11-05 NOTE — ED Notes (Signed)
MD at the bedside  

## 2016-11-05 NOTE — ED Provider Notes (Signed)
Emergency Department Provider Note   I have reviewed the triage vital signs and the nursing notes.   HISTORY  Chief Complaint Sore Throat   HPI Derek Jones is a 45 y.o. male with PMH of chronic knee pain and ED Presents to the emergency department for evaluation of sore throat. Patient states that for the last month he feels that something is running down the back of his throat. He has an associated cough that is productive of a mild amount of clear sputum. No associated fever or chills. He has some discomfort with swallowing but has no difficulty with eating or drinking. Denies voice changes. He feels as if there may be something in his throat. He reports his symptoms are worse at night when laying flat. Occasionally he will find himself choking on sputum but is able to easily cleared with a strong cough. No associated chest pain or difficulty breathing.  Past Medical History:  Diagnosis Date  . Chronic knee pain   . Chronic neck pain   . ED (erectile dysfunction)   . Kidney stone   . Low back pain     Patient Active Problem List   Diagnosis Date Noted  . Knee pain, left 02/25/2014  . Cervical strain 02/25/2014  . Ganglion cyst 06/01/2011    Past Surgical History:  Procedure Laterality Date  . SHOULDER SURGERY      Current Outpatient Rx  . Order #: [id number] Class: Print  . Order #: [id number] Class: Print    Allergies Codeine  Family History  Problem Relation Age of Onset  . Arthritis    . Cancer    . Diabetes Mother   . Stroke Maternal Grandmother   . Hypertension Maternal Grandmother     Social History Social History  Substance Use Topics  . Smoking status: Never Smoker  . Smokeless tobacco: Never Used  . Alcohol use Yes     Comment: 3 beers a week    Review of Systems  Constitutional: No fever/chills Eyes: No visual changes. ENT: Positive sore throat. Cardiovascular: Denies chest pain. Respiratory: Denies shortness of  breath. Gastrointestinal: No abdominal pain.  No nausea, no vomiting.  No diarrhea.  No constipation. Genitourinary: Negative for dysuria. Musculoskeletal: Negative for back pain. Skin: Negative for rash. Neurological: Negative for headaches, focal weakness or numbness.  10-point ROS otherwise negative.  ____________________________________________   PHYSICAL EXAM:  VITAL SIGNS: ED Triage Vitals  Enc Vitals Group     BP 11/05/16 0755 132/100     Pulse Rate 11/05/16 0755 76     Resp 11/05/16 0755 20     Temp 11/05/16 0755 98.2 F (36.8 C)     Temp Source 11/05/16 0755 Oral     SpO2 11/05/16 0755 97 %     Weight 11/05/16 0756 214 lb (97.1 kg)     Height 11/05/16 0756 5' 9.5" (1.765 m)     Pain Score 11/05/16 0756 1   Constitutional: Alert and oriented. Well appearing and in no acute distress. Eyes: Conjunctivae are normal.  Head: Atraumatic. Nose: No congestion/rhinnorhea. Mouth/Throat: Mucous membranes are moist.  Oropharynx with mild posterior erythema. No tonsillar exudate. No PTA. Managing oral secretions. No muffled voice.  Neck: No stridor.   Cardiovascular: Normal rate, regular rhythm. Good peripheral circulation. Grossly normal heart sounds.   Respiratory: Normal respiratory effort.  No retractions. Lungs CTAB. Gastrointestinal: Soft and nontender. No distention.  Musculoskeletal: No lower extremity tenderness nor edema. No gross deformities of extremities. Neurologic:  Normal  speech and language. No gross focal neurologic deficits are appreciated.  Skin:  Skin is warm, dry and intact. No rash noted. ____________________________________________   PROCEDURES  Procedure(s) performed:   Procedures  None ____________________________________________   INITIAL IMPRESSION / ASSESSMENT AND PLAN / ED COURSE  Pertinent labs & imaging results that were available during my care of the patient were reviewed by me and considered in my medical decision making (see chart  for details).  Patient resents to the emergency department for evaluation of sore throat and sensation of something running down the back of his throat. He has mild posterior pharyngeal erythema with no exudate. No abscess. Extremely low suspicion for deep space neck infection in this clinical scenario. The patient's symptoms seem most consistent with postnasal drip. He also reports a remote history of GERD and states he stopped taking his omeprazole. This could be contributing to symptoms. Plan to restart omeprazole and Claritin. He will continue conservative treatment at home and follow with his primary care physician.   At this time, I do not feel there is any life-threatening condition present. I have reviewed and discussed all results (EKG, imaging, lab, urine as appropriate), exam findings with patient. I have reviewed nursing notes and appropriate previous records.  I feel the patient is safe to be discharged home without further emergent workup. Discussed usual and customary return precautions. Patient and family (if present) verbalize understanding and are comfortable with this plan.  Patient will follow-up with their primary care provider. If they do not have a primary care provider, information for follow-up has been provided to them. All questions have been answered.  ____________________________________________  FINAL CLINICAL IMPRESSION(S) / ED DIAGNOSES  Final diagnoses:  Sore throat     MEDICATIONS GIVEN DURING THIS VISIT:  None  NEW OUTPATIENT MEDICATIONS STARTED DURING THIS VISIT:  New Prescriptions   LORATADINE (CLARITIN) 10 MG TABLET    Take 1 tablet (10 mg total) by mouth daily.   OMEPRAZOLE (PRILOSEC) 20 MG CAPSULE    Take 1 capsule (20 mg total) by mouth daily.      Note:  This document was prepared using Dragon voice recognition software and may include unintentional dictation errors.  Alona BeneJoshua Xiomar Crompton, MD Emergency Medicine   Maia PlanJoshua G Kyonna Frier, MD 11/05/16 0830

## 2016-11-05 NOTE — ED Notes (Signed)
Patient given discharge instruction, verbalized understand. Patient ambulatory out of the department.  

## 2016-11-08 DIAGNOSIS — M546 Pain in thoracic spine: Secondary | ICD-10-CM | POA: Diagnosis not present

## 2016-11-08 DIAGNOSIS — M5413 Radiculopathy, cervicothoracic region: Secondary | ICD-10-CM | POA: Diagnosis not present

## 2016-11-08 DIAGNOSIS — M9902 Segmental and somatic dysfunction of thoracic region: Secondary | ICD-10-CM | POA: Diagnosis not present

## 2016-11-08 DIAGNOSIS — M9901 Segmental and somatic dysfunction of cervical region: Secondary | ICD-10-CM | POA: Diagnosis not present

## 2016-11-09 DIAGNOSIS — Z6831 Body mass index (BMI) 31.0-31.9, adult: Secondary | ICD-10-CM | POA: Diagnosis not present

## 2016-11-09 DIAGNOSIS — M542 Cervicalgia: Secondary | ICD-10-CM | POA: Diagnosis not present

## 2017-02-08 DIAGNOSIS — M542 Cervicalgia: Secondary | ICD-10-CM | POA: Diagnosis not present

## 2017-06-06 ENCOUNTER — Encounter (HOSPITAL_COMMUNITY): Payer: Self-pay | Admitting: Emergency Medicine

## 2017-06-06 ENCOUNTER — Emergency Department (HOSPITAL_COMMUNITY): Payer: BLUE CROSS/BLUE SHIELD

## 2017-06-06 ENCOUNTER — Emergency Department (HOSPITAL_COMMUNITY)
Admission: EM | Admit: 2017-06-06 | Discharge: 2017-06-06 | Disposition: A | Discharge status: Home / Self Care | Payer: BLUE CROSS/BLUE SHIELD | Attending: Emergency Medicine | Admitting: Emergency Medicine

## 2017-06-06 DIAGNOSIS — Y929 Unspecified place or not applicable: Secondary | ICD-10-CM | POA: Diagnosis not present

## 2017-06-06 DIAGNOSIS — S39012A Strain of muscle, fascia and tendon of lower back, initial encounter: Secondary | ICD-10-CM | POA: Insufficient documentation

## 2017-06-06 DIAGNOSIS — Y939 Activity, unspecified: Secondary | ICD-10-CM | POA: Diagnosis not present

## 2017-06-06 DIAGNOSIS — Y33XXXA Other specified events, undetermined intent, initial encounter: Secondary | ICD-10-CM | POA: Diagnosis not present

## 2017-06-06 DIAGNOSIS — M5136 Other intervertebral disc degeneration, lumbar region: Secondary | ICD-10-CM | POA: Insufficient documentation

## 2017-06-06 DIAGNOSIS — Y999 Unspecified external cause status: Secondary | ICD-10-CM | POA: Diagnosis not present

## 2017-06-06 DIAGNOSIS — M545 Low back pain: Secondary | ICD-10-CM | POA: Diagnosis not present

## 2017-06-06 DIAGNOSIS — Z79899 Other long term (current) drug therapy: Secondary | ICD-10-CM | POA: Diagnosis not present

## 2017-06-06 LAB — URINALYSIS, ROUTINE W REFLEX MICROSCOPIC
BILIRUBIN URINE: NEGATIVE
Glucose, UA: NEGATIVE mg/dL
Hgb urine dipstick: NEGATIVE
KETONES UR: NEGATIVE mg/dL
Leukocytes, UA: NEGATIVE
NITRITE: NEGATIVE
PH: 5 (ref 5.0–8.0)
PROTEIN: NEGATIVE mg/dL
Specific Gravity, Urine: 1.027 (ref 1.005–1.030)

## 2017-06-06 MED ORDER — METHOCARBAMOL 500 MG PO TABS: 500.0000 mg | ORAL_TABLET | Freq: Three times a day (TID) | ORAL | 0 refills | Status: DC

## 2017-06-06 MED ORDER — DEXAMETHASONE 4 MG PO TABS: 4.0000 mg | ORAL_TABLET | Freq: Two times a day (BID) | ORAL | 0 refills | Status: DC

## 2017-06-06 MED ORDER — DICLOFENAC SODIUM 75 MG PO TBEC
75.0000 mg | DELAYED_RELEASE_TABLET | Freq: Two times a day (BID) | ORAL | 0 refills | Status: DC
Start: 2017-06-06 — End: 2020-06-07

## 2017-06-06 NOTE — Discharge Instructions (Signed)
Your urine test is negative for evidence of infection or kidney stone. Your x-ray reveals degenerative disc disease involving the lower back at the L5-S1 area. Your examination suggest some muscle strain of the lower back and left flank area. Please use a heating pad to your back and flank area at rest. Please use diclofenac, Decadron, and Robaxin for your pain and discomfort until seen by Dr. Romeo AppleHarrison, or the orthopedic specialist of your choice. Please call Dr. Romeo AppleHarrison for appointment as soon as possible.

## 2017-06-06 NOTE — ED Provider Notes (Signed)
[facility] DEPT Provider Note   CSN: [id number] Arrival date & time: 06/06/17  0944     History   Chief Complaint Chief Complaint  Patient presents with  . Back Pain    HPI Derek Jones is a 46 y.o. male.  The history is provided by the patient.  Back Pain   This is a new problem. Episode onset: 2 months. The problem occurs daily. The problem has not changed since onset.The pain is associated with no known injury. The pain is present in the lumbar spine. The quality of the pain is described as aching. Radiates to: left hip. The pain is moderate. Exacerbated by: nothing. The pain is the same all the time. Associated symptoms include paresthesias. Pertinent negatives include no chest pain, no fever, no numbness, no abdominal pain, no bowel incontinence, no bladder incontinence, no dysuria and no weakness. Associated symptoms comments: Mild inner thigh numbness. He has tried heat (lidocaine back patch) for the symptoms. The treatment provided mild relief.    Past Medical History:  Diagnosis Date  . Chronic knee pain   . Chronic neck pain   . ED (erectile dysfunction)   . Kidney stone   . Low back pain     Patient Active Problem List   Diagnosis Date Noted  . Knee pain, left 02/25/2014  . Cervical strain 02/25/2014  . Ganglion cyst 06/01/2011    Past Surgical History:  Procedure Laterality Date  . SHOULDER SURGERY         Home Medications    Prior to Admission medications   Medication Sig Start Date End Date Taking? Authorizing Provider  loratadine (CLARITIN) 10 MG tablet Take 1 tablet (10 mg total) by mouth daily. 11/05/16   Long, Arlyss RepressJoshua G, MD  omeprazole (PRILOSEC) 20 MG capsule Take 1 capsule (20 mg total) by mouth daily. 11/05/16   Long, Arlyss RepressJoshua G, MD    Family History Family History  Problem Relation Age of Onset  . Arthritis Unknown   . Cancer Unknown   . Diabetes Mother   . Stroke Maternal Grandmother   . Hypertension Maternal Grandmother      Social History Social History  Substance Use Topics  . Smoking status: Never Smoker  . Smokeless tobacco: Never Used  . Alcohol use Yes     Comment: 3 beers a week     Allergies   Codeine   Review of Systems Review of Systems  Constitutional: Negative for activity change, appetite change and fever.  HENT: Negative for congestion, ear discharge, ear pain, facial swelling, nosebleeds, rhinorrhea, sneezing and tinnitus.   Eyes: Negative for photophobia, pain and discharge.  Respiratory: Negative for cough, choking, shortness of breath and wheezing.   Cardiovascular: Negative for chest pain, palpitations and leg swelling.  Gastrointestinal: Negative for abdominal pain, blood in stool, bowel incontinence, constipation, diarrhea, nausea and vomiting.  Genitourinary: Negative for bladder incontinence, difficulty urinating, dysuria, flank pain, frequency and hematuria.  Musculoskeletal: Positive for back pain. Negative for gait problem, myalgias and neck pain.  Skin: Negative for color change, rash and wound.  Neurological: Positive for paresthesias. Negative for dizziness, seizures, syncope, facial asymmetry, speech difficulty, weakness and numbness.  Hematological: Negative for adenopathy. Does not bruise/bleed easily.  Psychiatric/Behavioral: Negative for agitation, confusion, hallucinations, self-injury and suicidal ideas. The patient is not nervous/anxious.      Physical Exam Updated Vital Signs BP (!) 137/91 (BP Location: Right Arm)   Pulse 64   Temp 98 F (36.7 C) (Oral)  Resp 18   Ht 5\' 10"  (1.778 m)   Wt 97.1 kg (214 lb)   SpO2 98%   BMI 30.71 kg/m   Physical Exam  Constitutional: Vital signs are normal. He appears well-developed and well-nourished. He is active.  HENT:  Head: Normocephalic and atraumatic.  Right Ear: Tympanic membrane, external ear and ear canal normal.  Left Ear: Tympanic membrane, external ear and ear canal normal.  Nose: Nose normal.   Mouth/Throat: Uvula is midline, oropharynx is clear and moist and mucous membranes are normal.  Eyes: Conjunctivae, EOM and lids are normal. Pupils are equal, round, and reactive to light.  Neck: Trachea normal, normal range of motion and phonation normal. Neck supple. Carotid bruit is not present.  Cardiovascular: Normal rate, regular rhythm and normal pulses.   Abdominal: Soft. Normal appearance and bowel sounds are normal.  Musculoskeletal:       Lumbar back: He exhibits tenderness and spasm. He exhibits normal range of motion and no deformity.       Back:  Lymphadenopathy:       Head (right side): No submental, no preauricular and no posterior auricular adenopathy present.       Head (left side): No submental, no preauricular and no posterior auricular adenopathy present.    He has no cervical adenopathy.  Neurological: He is alert. He has normal strength. No cranial nerve deficit or sensory deficit. GCS eye subscore is 4. GCS verbal subscore is 5. GCS motor subscore is 6.  Skin: Skin is warm and dry.  Psychiatric: His speech is normal.     ED Treatments / Results  Labs (all labs ordered are listed, but only abnormal results are displayed) Labs Reviewed - No data to display  EKG  EKG Interpretation None       Radiology No results found.  Procedures Procedures (including critical care time)  Medications Ordered in ED Medications - No data to display   Initial Impression / Assessment and Plan / ED Course  I have reviewed the triage vital signs and the nursing notes.  Pertinent labs & imaging results that were available during my care of the patient were reviewed by me and considered in my medical decision making (see chart for details).       Final Clinical Impressions(s) / ED Diagnoses MDM Vital signs reviewed.  No gross neurologic deficit appreciated on the examination at this time. Pain is reproduced with range of motion of the lumbar spine area. X-ray of  the lumbar spine reveals degenerative disc disease and facet disease. The plan at this time will be for the patient use a heating pad. Anti-inflammatory medication and muscle relaxers. The patient will be referred to orthopedics for additional evaluation and management.    Final diagnoses:  DDD (degenerative disc disease), lumbar  Strain of lumbar region, initial encounter    New Prescriptions New Prescriptions   No medications on file     Ivery Quale, Cordelia Poche 06/06/17 1148    Donnetta Hutching, MD 06/08/17 1446

## 2017-06-06 NOTE — ED Triage Notes (Signed)
PT c/o lower back pain x2 months with no known injury. PT states pain doesn't radiate anywhere else. Pt ambulatory from triage and drove himself to ED.

## 2017-06-20 ENCOUNTER — Encounter: Payer: Self-pay | Admitting: Orthopedic Surgery

## 2017-06-20 ENCOUNTER — Ambulatory Visit (INDEPENDENT_AMBULATORY_CARE_PROVIDER_SITE_OTHER): Payer: BLUE CROSS/BLUE SHIELD | Admitting: Orthopedic Surgery

## 2017-06-20 VITALS — BP 120/82 | HR 86 | Ht 70.0 in | Wt 202.0 lb

## 2017-06-20 DIAGNOSIS — M545 Low back pain, unspecified: Secondary | ICD-10-CM

## 2017-06-20 DIAGNOSIS — M5137 Other intervertebral disc degeneration, lumbosacral region: Secondary | ICD-10-CM | POA: Diagnosis not present

## 2017-06-20 MED ORDER — TIZANIDINE HCL 4 MG PO TABS: 4.0000 mg | ORAL_TABLET | Freq: Four times a day (QID) | ORAL | 1 refills | Status: DC | PRN

## 2017-06-20 NOTE — Progress Notes (Signed)
NEW PROBLEMOFFICE VISIT    Chief Complaint  Patient presents with  . Follow-up    ER follow up on low back pain.    46 year old male presents to [place] with a history of new-onset lower back pain for the last 2 months which is located over his lower lumbar spine primarily in the left side radiating to the left hip. It is dull constant and is not associated with any numbness tingling or weakness. He does get some relief from the Voltaren and Robaxin    Review of Systems  Constitutional: Negative for chills, fever and weight loss.  Respiratory: Negative for shortness of breath.   Cardiovascular: Negative for chest pain.  Gastrointestinal: Negative.   Genitourinary: Negative.   Neurological: Negative for tingling.     Past Medical History:  Diagnosis Date  . Chronic knee pain   . Chronic neck pain   . ED (erectile dysfunction)   . Kidney stone   . Low back pain     Past Surgical History:  Procedure Laterality Date  . SHOULDER SURGERY      Family History  Problem Relation Age of Onset  . Arthritis Unknown   . Cancer Unknown   . Diabetes Mother   . Stroke Maternal Grandmother   . Hypertension Maternal Grandmother    Social History  Substance Use Topics  . Smoking status: Never Smoker  . Smokeless tobacco: Never Used  . Alcohol use Yes     Comment: 3 beers a week    Current Outpatient Prescriptions:  .  dexamethasone (DECADRON) 4 MG tablet, Take 1 tablet (4 mg total) by mouth 2 (two) times daily with a meal., Disp: 12 tablet, Rfl: 0 .  diclofenac (VOLTAREN) 75 MG EC tablet, Take 1 tablet (75 mg total) by mouth 2 (two) times daily., Disp: 14 tablet, Rfl: 0 .  methocarbamol (ROBAXIN) 500 MG tablet, Take 1 tablet (500 mg total) by mouth 3 (three) times daily., Disp: 21 tablet, Rfl: 0  Current Facility-Administered Medications:  .  methylPREDNISolone acetate (DEPO-MEDROL) injection 40 mg, 40 mg, Intra-articular, Once, Vickki Hearing, MD  BP 120/82   Pulse 86    Ht 5\' 10"  (1.778 m)   Wt 202 lb (91.6 kg)   BMI 28.98 kg/m   Physical Exam  Constitutional: He is oriented to person, place, and time. He appears well-developed and well-nourished.  Vital signs have been reviewed and are stable. Gen. appearance the patient is well-developed and well-nourished with normal grooming and hygiene.   Neurological: He is alert and oriented to person, place, and time.  Skin: Skin is warm and dry. No erythema.  Psychiatric: He has a normal mood and affect.  Vitals reviewed.   Back Exam  Sensation: Normal. Gait: Normal.  Tenderness  The patient is experiencing tenderness in the lumbar.  Range of Motion  Flexion:                  Extension:                Lateral Bend Left:    Normal Lateral Bend Right:  Normal Rotation Right:         Normal Rotation Left:           Normal SLR   Right: Negative Left:    Negative  Muscle Strength  Hip Abductors:  5/5 Hip Adductors:  5/5 Quadriceps:      5/5 Hamstrings:      5/5  Reflexes  Patellar:  Normal  Achilles:  Normal  Comments:  Normal pulse and perfusion was normal in each leg without any peripheral edema     Encounter Diagnoses  Name Primary?  . Acute left-sided low back pain without sciatica Yes  . DDD (degenerative disc disease), lumbosacral    He did have an x-ray which showed some mild degenerative disc disease at L5-S1  I reviewed his report as well as independently read the x-ray as degenerative disc disease L5-S1  Report There is no acute fracture.   There is degenerative disc disease with disc height loss at L5-S1. There is mild bilateral facet arthropathy at L5-S1.   The SI joints are unremarkable.   IMPRESSION: 1. Degenerative disc disease with facet arthropathy at L5-S1.     Electronically Signed   By: Elige KoHetal  Patel   On: 06/06/2017 10:43    PLAN:   Recommend he continue Voltaren, he wanted a new muscle relaxer so we put him on Zanaflex Recommend physical therapy for  6 weeks Follow-up 6 weeks

## 2017-06-20 NOTE — Patient Instructions (Addendum)
CONTINUE VOLTAREN START THERAPY  STOP ROBAXIN START ZANAFLEX  Back Pain, Adult Back pain is very common. The pain often gets better over time. The cause of back pain is usually not dangerous. Most people can learn to manage their back pain on their own. Follow these instructions at home: Watch your back pain for any changes. The following actions may help to lessen any pain you are feeling:  Stay active. Start with short walks on flat ground if you can. Try to walk farther each day.  Exercise regularly as told by your doctor. Exercise helps your back heal faster. It also helps avoid future injury by keeping your muscles strong and flexible.  Do not sit, drive, or stand in one place for more than 30 minutes.  Do not stay in bed. Resting more than 1-2 days can slow down your recovery.  Be careful when you bend or lift an object. Use good form when lifting: ? Bend at your knees. ? Keep the object close to your body. ? Do not twist.  Sleep on a firm mattress. Lie on your side, and bend your knees. If you lie on your back, put a pillow under your knees.  Take medicines only as told by your doctor.  Put ice on the injured area. ? Put ice in a plastic bag. ? Place a towel between your skin and the bag. ? Leave the ice on for 20 minutes, 2-3 times a day for the first 2-3 days. After that, you can switch between ice and heat packs.  Avoid feeling anxious or stressed. Find good ways to deal with stress, such as exercise.  Maintain a healthy weight. Extra weight puts stress on your back.  Contact a doctor if:  You have pain that does not go away with rest or medicine.  You have worsening pain that goes down into your legs or buttocks.  You have pain that does not get better in one week.  You have pain at night.  You lose weight.  You have a fever or chills. Get help right away if:  You cannot control when you poop (bowel movement) or pee (urinate).  Your arms or legs feel  weak.  Your arms or legs lose feeling (numbness).  You feel sick to your stomach (nauseous) or throw up (vomit).  You have belly (abdominal) pain.  You feel like you may pass out (faint). This information is not intended to replace advice given to you by your health care provider. Make sure you discuss any questions you have with your health care provider. Document Released: 05/24/2008 Document Revised: 05/13/2016 Document Reviewed: 04/09/2014 Elsevier Interactive Patient Education  [employer]2018 Elsevier Inc.

## 2017-06-27 ENCOUNTER — Ambulatory Visit (HOSPITAL_COMMUNITY): Payer: BLUE CROSS/BLUE SHIELD | Attending: Physical Therapy | Admitting: Physical Therapy

## 2017-06-29 ENCOUNTER — Ambulatory Visit (HOSPITAL_COMMUNITY): Payer: BLUE CROSS/BLUE SHIELD | Admitting: Physical Therapy

## 2017-08-01 ENCOUNTER — Encounter: Payer: Self-pay | Admitting: Orthopedic Surgery

## 2017-08-01 ENCOUNTER — Ambulatory Visit: Payer: BLUE CROSS/BLUE SHIELD | Admitting: Orthopedic Surgery

## 2017-08-09 ENCOUNTER — Ambulatory Visit (HOSPITAL_COMMUNITY): Payer: BLUE CROSS/BLUE SHIELD | Attending: Orthopedic Surgery | Admitting: Physical Therapy

## 2017-08-09 DIAGNOSIS — M5416 Radiculopathy, lumbar region: Secondary | ICD-10-CM | POA: Diagnosis not present
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
## 2017-08-09 NOTE — Patient Instructions (Addendum)
Knee-to-Chest Stretch: Unilateral    With hand behind right knee, pull knee in to chest until a comfortable stretch is felt in lower back and buttocks. Keep back relaxed. Hold ____ seconds. Repeat ____ times per set. Do ____ sets per session. Do ____ sessions per day.  http://orth.exer.us/126   Copyright  VHI. All rights reserved.  Hamstring Stretch: Active    Support behind right knee. Starting with knee bent, attempt to straighten knee until a comfortable stretch is felt in back of thigh. Hold _20___ seconds. Repeat __3__ times per set. Do __1__ sets per session. Do __2__ sessions per day.  http://orth.exer.us/158   Copyright  VHI. All rights reserved.  Isometric Abdominal    Lying on back with knees bent, tighten stomach by pressing elbows down. Hold _5___ seconds. Repeat ___10_ times per set. Do ___1_ sets per session. Do ___2_ sessions per day.  http://orth.exer.us/1086   Copyright  VHI. All rights reserved.  Bent Leg Lift (Hook-Lying)    Tighten stomach and slowly raise right leg _3___ inches from floor. Keep trunk rigid. Hold __3__ seconds. Repeat __10__ times per set. Do __1__ sets per session. Do __2__ sessions per day.  http://orth.exer.us/1090   Copyright  VHI. All rights reserved.

## 2017-08-12 ENCOUNTER — Ambulatory Visit (HOSPITAL_COMMUNITY): Payer: BLUE CROSS/BLUE SHIELD | Admitting: Physical Therapy

## 2017-08-16 ENCOUNTER — Ambulatory Visit (HOSPITAL_COMMUNITY): Payer: BLUE CROSS/BLUE SHIELD | Admitting: Physical Therapy

## 2017-08-16 ENCOUNTER — Telehealth (HOSPITAL_COMMUNITY): Payer: Self-pay | Admitting: Physical Therapy

## 2017-08-16 NOTE — Telephone Encounter (Signed)
Pt did not show for appointment.  Called and patient stated he was still in [place].  Reported he would be at appointment tomorrow morning at 8:15 Lurena Nida, PTA/CLT [phone]

## 2017-08-17 ENCOUNTER — Ambulatory Visit (HOSPITAL_COMMUNITY): Payer: BLUE CROSS/BLUE SHIELD | Admitting: Physical Therapy

## 2017-08-17 ENCOUNTER — Telehealth (HOSPITAL_COMMUNITY): Payer: Self-pay | Admitting: Physical Therapy

## 2017-08-17 ENCOUNTER — Telehealth (HOSPITAL_COMMUNITY): Payer: Self-pay | Admitting: General Practice

## 2017-08-17 NOTE — Telephone Encounter (Signed)
Called pt re missed appointment. Therapist let pt know that his next appointment is Tuesday but all other appointments will be cancelled due to no show policy.    Derek Jones, PT CLT [phone]

## 2017-08-17 NOTE — Telephone Encounter (Signed)
08/17/17  Pt left a message at 8:27am to see when his next appt was.  He said that he would like a reminder call.    I called him back but had to leave a message and told him about his appt today that he missed and his next appt date and time.  I also said that he should have gotten a reminder call from our automated system and he would receive a reminder the day before his next appt.

## 2017-08-19 ENCOUNTER — Encounter: Payer: Self-pay | Admitting: Orthopedic Surgery

## 2017-08-19 ENCOUNTER — Ambulatory Visit: Payer: BLUE CROSS/BLUE SHIELD | Admitting: Orthopedic Surgery

## 2017-08-23 ENCOUNTER — Ambulatory Visit (HOSPITAL_COMMUNITY): Payer: BLUE CROSS/BLUE SHIELD | Attending: Orthopedic Surgery | Admitting: Physical Therapy

## 2017-08-23 DIAGNOSIS — M5416 Radiculopathy, lumbar region: Secondary | ICD-10-CM | POA: Diagnosis not present

## 2017-08-23 NOTE — Therapy (Addendum)
[facility] [address] , [place], [address] Phone: [phone]   Fax:  [phone]  Physical Therapy Treatment  Patient Details  Name: Derek Jones MRN: [id number] Date of Birth: 1971-02-23 Referring Provider: Fuller CanadaStanley Harrison   Encounter Date: 08/23/2017      PT End of Session - 08/23/17 1030    Visit Number 2   Number of Visits 8   Date for PT Re-Evaluation 09/08/17   Authorization - Visit Number 2   Authorization - Number of Visits 8   PT Start Time 0818   PT Stop Time 0853   PT Time Calculation (min) 35 min   Activity Tolerance Patient tolerated treatment well   Behavior During Therapy [facility] for tasks assessed/performed      Past Medical History:  Diagnosis Date  . Chronic knee pain   . Chronic neck pain   . ED (erectile dysfunction)   . Kidney stone   . Low back pain     Past Surgical History:  Procedure Laterality Date  . SHOULDER SURGERY      There were no vitals filed for this visit.      Subjective Assessment - 08/23/17 0844    Subjective Pt states he is not having any radiculopathy in his Lt hip currently.  States he had a sharp pain on saturday in his lumbar and into his Lt hip.  States the incident of radiculopathy has decreased and pain is not as severe as it was.  Currently 4/10   Currently in Pain? Yes   Pain Score 4    Pain Location Back   Pain Orientation Lower;Mid   Pain Descriptors / Indicators Aching;Throbbing;Shooting;Stabbing   Pain Radiating Towards periods of sharp shooting pains into Lt hip                         [facility] Adult PT Treatment/Exercise - 08/23/17 0001      Exercises   Exercises Lumbar     Lumbar Exercises: Stretches   Active Hamstring Stretch 2 reps;20 seconds   Single Knee to Chest Stretch 2 reps;20 seconds   Prone on Elbows Stretch 1 rep   Prone on Elbows Stretch Limitations 2 minutes     Lumbar Exercises: Seated   Sit to Stand 10 reps   Sit  to Stand Limitations no UE     Lumbar Exercises: Supine   Ab Set 5 reps   Bent Knee Raise 5 reps   Bridge 10 reps   Straight Leg Raise 10 reps     Lumbar Exercises: Sidelying   Hip Abduction 10 reps     Lumbar Exercises: Prone   Single Arm Raise 10 reps   Straight Leg Raise 10 reps                PT Education - 08/23/17 1027    Education provided Yes   Education Details reviewed HEP and goals with patient   Person(s) Educated Patient   Methods Explanation;Demonstration;Tactile cues;Verbal cues;Handout   Comprehension Verbalized understanding;Returned demonstration;Verbal cues required;Tactile cues required          PT Short Term Goals - 08/23/17 1028      PT SHORT TERM GOAL #1   Title Pt to verbalize the importance of and be demonstrating good sitting posture   Time 2   Period Weeks   Status On-going     PT SHORT TERM GOAL #2   Title Pt to  state that there is no radicular sx in his left leg to demonstrate decreased nerve irritation.    Time 2   Period Weeks   Status On-going     PT SHORT TERM GOAL #3   Title Pt to be able to sit for 45 minutes without having any increased back or leg pain.    Time 2   Period Weeks   Status On-going     PT SHORT TERM GOAL #4   Title Pt to be able to stand for 20 minutes without any increased back or leg pain    Time 2   Period Weeks   Status Achieved           PT Long Term Goals - 08/23/17 1028      PT LONG TERM GOAL #1   Title PT to be able to verbalize and demonstrate the importance of proper lifting    Time 4   Period Weeks   Status On-going     PT LONG TERM GOAL #2   Title Pt to state that he is not having any back or hip pain and the greatest pain he has had for the past week is a 2/10.   Time 4   Period Weeks   Status On-going     PT LONG TERM GOAL #3   Title Pt to be able to sit for an hour without having any increased back or hip pain.    Time 4   Period Weeks   Status On-going     PT LONG  TERM GOAL #4   Title Pt to be able to ride his motorcycle without any increased back or hip pain    Time 4   Period Weeks   Status On-going               Plan - 08/23/17 1031    Clinical Impression Statement Pt has not been at therapy since initial evaluationX 2 weeks prior due to work schedule.  Reviewed HEP and goals per intial evaluation.  Pt with reported complaince with HEP and overall reduced occurance of radiating symptoms and high pain.  Progressed this session with added exericses per PT POC.  Pt able to complete with cues for stabilization and form.  Pt reported relief with POE stretch and states he does standing extension throughout the day.     Rehab Potential Good   PT Frequency 2x / week   PT Duration 4 weeks   PT Treatment/Interventions ADLs/Self Care Home Management;Functional mobility training;Therapeutic activities;Therapeutic exercise;Manual techniques   PT Next Visit Plan continue education on proper transition from sit to supine as well as postural eduction.  Progress to standing stabilization, hip excursions and proper lifting techniques.  May do manual as needed.    PT Home Exercise Plan 08/09/2017:  knee to chest, hamstring stretch(active), ab set and bent knee raise.  9/4: SLR, bridge, Sidelying hip abduction, sit to stand   Consulted and Agree with Plan of Care Patient      Patient will benefit from skilled therapeutic intervention in order to improve the following deficits and impairments:  Decreased activity tolerance, Decreased range of motion, Decreased strength, Impaired flexibility, Improper body mechanics, Pain  Visit Diagnosis: Radiculopathy, lumbar region     Problem List Patient Active Problem List   Diagnosis Date Noted  . Knee pain, left 02/25/2014  . Cervical strain 02/25/2014  . Ganglion cyst 06/01/2011   Lurena Nida, PTA/CLT [phone]  Bascom Levels, Eliberto Sole B 08/23/2017,  10:37 AM  [facility] [address] [place], [place], [address] Phone: [phone]   Fax:  [phone]  Name: Derek Jones MRN: [id number] Date of Birth: Mar 04, 1971      09/01/2018  PT did not return to follow up visits.  Pt was given a HEP but unknown status and goals due to not returning.  Virgina Organ, PT CLT [phone]

## 2017-08-23 NOTE — Patient Instructions (Signed)
Straight Leg Raise    Tighten stomach and slowly raise locked right leg _10_ inches from floor. Repeat _10_ times per set.Do _2_ sessions per day.  Functional Quadriceps: Sit to Stand    Sit on edge of chair, feet flat on floor. Stand upright, extending knees fully.Repeat _10_ times per set. Do _2_ sessions per day.  Abduction: Side Leg Lift (Eccentric) - Side-Lying    Lie on side. Lift top leg slightly higher than shoulder level. Keep top leg straight with body, toes pointing forward. Slowly lower for 3-5 seconds. _10__ reps per set, __2_ sets per day  ridge Pose    Press small of back into mat, maintain pelvic tilt, roll up one vertebrae at a time. Focus on engaging posterior hip muscles. Repeat _10__ times.

## 2017-08-25 ENCOUNTER — Ambulatory Visit (HOSPITAL_COMMUNITY): Payer: BLUE CROSS/BLUE SHIELD | Admitting: Physical Therapy

## 2017-08-25 ENCOUNTER — Encounter (HOSPITAL_COMMUNITY): Payer: BLUE CROSS/BLUE SHIELD | Admitting: Physical Therapy

## 2017-08-30 ENCOUNTER — Encounter (HOSPITAL_COMMUNITY): Payer: BLUE CROSS/BLUE SHIELD | Admitting: Physical Therapy

## 2017-09-01 ENCOUNTER — Encounter (HOSPITAL_COMMUNITY): Payer: BLUE CROSS/BLUE SHIELD | Admitting: Physical Therapy

## 2017-09-06 ENCOUNTER — Encounter (HOSPITAL_COMMUNITY): Payer: BLUE CROSS/BLUE SHIELD | Admitting: Physical Therapy

## 2017-09-07 ENCOUNTER — Ambulatory Visit (INDEPENDENT_AMBULATORY_CARE_PROVIDER_SITE_OTHER): Payer: BLUE CROSS/BLUE SHIELD | Admitting: Orthopedic Surgery

## 2017-09-07 ENCOUNTER — Encounter: Payer: Self-pay | Admitting: Orthopedic Surgery

## 2017-09-07 VITALS — BP 141/87 | HR 93 | Ht 70.0 in | Wt 209.0 lb

## 2017-09-07 DIAGNOSIS — M545 Low back pain, unspecified: Secondary | ICD-10-CM

## 2017-09-07 DIAGNOSIS — G8929 Other chronic pain: Secondary | ICD-10-CM

## 2017-09-07 DIAGNOSIS — M5137 Other intervertebral disc degeneration, lumbosacral region: Secondary | ICD-10-CM | POA: Diagnosis not present

## 2017-09-07 MED ORDER — MELOXICAM 7.5 MG PO TABS: 7.5000 mg | ORAL_TABLET | Freq: Every day | ORAL | 2 refills | Status: DC

## 2017-09-07 MED ORDER — TIZANIDINE HCL 4 MG PO TABS: 4.0000 mg | ORAL_TABLET | Freq: Four times a day (QID) | ORAL | 1 refills | Status: DC | PRN

## 2017-09-07 NOTE — Progress Notes (Signed)
Routine follow-up  Chief Complaint  Patient presents with  . Follow-up    DDD    Initial history 46 year old male presents to [place] with a history of new-onset lower back pain for the last 2 months which is located over his lower lumbar spine primarily in the left side radiating to the left hip. It is dull constant and is not associated with any numbness tingling or weakness. He does get some relief from the Voltaren and Robaxin  Initial treatment Recommend he continue Voltaren, he wanted a new muscle relaxer so we put him on Zanaflex Recommend physical therapy for 6 weeks Follow-up 6 weeks  Today he reports no major improvement. He is having tightness and lower back pain, primarily left lower back and midline.  Review of systems denies any radicular pain.  BP (!) 141/87   Pulse 93   Ht  (1.778 m)   Wt 209 lb (94.8 kg)   BMI 29.99 kg/m  He is awake alert and oriented 3 mood and affect normal overall appearance well-groomed   Back Exam  Sensation: Normal. Gait: Normal.  Tenderness  The patient is experiencing tenderness in the lumbar.  Range of Motion  Flexion:                Abnormal Extension:                Abnormal Lateral Bend Left:    Lateral Bend Right:  Rotation Right:         Rotation Left:            Muscle Strength  Hip Abductors:  5/5 Hip Adductors:  5/5 Quadriceps:      5/5 Hamstrings:      5/5  Tests  Toe Walk: Normal Heel Walk: Normal  Comments:  His left lower extremity is normally aligned has normal range of motion including the hip normal stability in the hip knee and ankle motor exam normal throughout the extremity skin normal over the back no Harry patches or lesions no erythema in the left leg sensation normal including normal reflexes in the knee and ankle with vascular exam normal negative straight leg raise    Meds ordered this encounter  Medications  . meloxicam (MOBIC) 7.5 MG tablet    Sig: Take 1 tablet (7.5 mg total) by mouth  daily.    Dispense:  30 tablet    Refill:  2  . tiZANidine (ZANAFLEX) 4 MG tablet    Sig: Take 1 tablet (4 mg total) by mouth every 6 (six) hours as needed for muscle spasms.    Dispense:  56 tablet    Refill:  1    Plan continue physical therapy refills Zanaflex. Start Mobitz one a day.  Follow-up 6 weeks  He's advised to let me know if he has any radicular pain

## 2017-10-19 ENCOUNTER — Ambulatory Visit: Payer: BLUE CROSS/BLUE SHIELD | Admitting: Orthopedic Surgery

## 2017-10-24 ENCOUNTER — Ambulatory Visit (INDEPENDENT_AMBULATORY_CARE_PROVIDER_SITE_OTHER): Payer: BLUE CROSS/BLUE SHIELD | Admitting: Orthopedic Surgery

## 2017-10-24 VITALS — BP 140/98 | HR 69 | Ht 70.0 in | Wt 209.0 lb

## 2017-10-24 DIAGNOSIS — M5137 Other intervertebral disc degeneration, lumbosacral region: Secondary | ICD-10-CM

## 2017-10-24 NOTE — Progress Notes (Signed)
Chief Complaint  Patient presents with  . Follow-up    Recheck on back pain    46 yo male with ho back pain which has improved on the current medications which include tizanidine.  He only has pain after he is been sitting for long period of time and after activity.  Examination shows he can flex down to his toes with no pain he has no pain with extension straight leg raises were normal     Encounter Diagnosis  Name Primary?  . DDD (degenerative disc disease), lumbosacral Yes   He will continue with current medication follow-up with us as needed precautions given for radicular symptoms or bowel or bladder dysfunction
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
## 2018-04-13 DIAGNOSIS — R079 Chest pain, unspecified: Secondary | ICD-10-CM | POA: Diagnosis not present

## 2018-04-13 DIAGNOSIS — Z79899 Other long term (current) drug therapy: Secondary | ICD-10-CM | POA: Diagnosis not present

## 2018-04-13 DIAGNOSIS — R072 Precordial pain: Secondary | ICD-10-CM | POA: Diagnosis not present

## 2018-04-14 ENCOUNTER — Emergency Department (HOSPITAL_COMMUNITY)
Admission: EM | Admit: 2018-04-14 | Discharge: 2018-04-14 | Disposition: A | Discharge status: Home / Self Care | Payer: BLUE CROSS/BLUE SHIELD | Attending: Emergency Medicine | Admitting: Emergency Medicine

## 2018-04-14 ENCOUNTER — Emergency Department (HOSPITAL_COMMUNITY): Payer: BLUE CROSS/BLUE SHIELD

## 2018-04-14 ENCOUNTER — Encounter (HOSPITAL_COMMUNITY): Payer: Self-pay

## 2018-04-14 DIAGNOSIS — R079 Chest pain, unspecified: Secondary | ICD-10-CM

## 2018-04-14 LAB — CBC WITH DIFFERENTIAL/PLATELET
BASOS ABS: 0 10*3/uL (ref 0.0–0.1)
Basophils Relative: 0 %
Eosinophils Absolute: 0.1 10*3/uL (ref 0.0–0.7)
Eosinophils Relative: 1 %
HEMATOCRIT: 42.6 % (ref 39.0–52.0)
HEMOGLOBIN: 14.1 g/dL (ref 13.0–17.0)
Lymphocytes Relative: 37 %
Lymphs Abs: 2.6 10*3/uL (ref 0.7–4.0)
MCH: 29.6 pg (ref 26.0–34.0)
MCHC: 33.1 g/dL (ref 30.0–36.0)
MCV: 89.5 fL (ref 78.0–100.0)
Monocytes Absolute: 0.7 10*3/uL (ref 0.1–1.0)
Monocytes Relative: 9 %
NEUTROS ABS: 3.6 10*3/uL (ref 1.7–7.7)
NEUTROS PCT: 53 %
Platelets: 227 10*3/uL (ref 150–400)
RBC: 4.76 MIL/uL (ref 4.22–5.81)
RDW: 13.2 % (ref 11.5–15.5)
WBC: 6.9 10*3/uL (ref 4.0–10.5)

## 2018-04-14 LAB — COMPREHENSIVE METABOLIC PANEL
ALBUMIN: 4.1 g/dL (ref 3.5–5.0)
ALT: 16 U/L — ABNORMAL LOW (ref 17–63)
AST: 21 U/L (ref 15–41)
Alkaline Phosphatase: 92 U/L (ref 38–126)
Anion gap: 10 (ref 5–15)
BILIRUBIN TOTAL: 0.5 mg/dL (ref 0.3–1.2)
BUN: 13 mg/dL (ref 6–20)
CO2: 28 mmol/L (ref 22–32)
Calcium: 9.7 mg/dL (ref 8.9–10.3)
Chloride: 103 mmol/L (ref 101–111)
Creatinine, Ser: 1.14 mg/dL (ref 0.61–1.24)
GFR calc Af Amer: >60 mL/min (ref >60)
GFR calc non Af Amer: >60 mL/min (ref >60)
GLUCOSE: 107 mg/dL — AB (ref 65–99)
POTASSIUM: 4.4 mmol/L (ref 3.5–5.1)
Sodium: 141 mmol/L (ref 135–145)
Total Protein: 7.6 g/dL (ref 6.5–8.1)

## 2018-04-14 LAB — I-STAT TROPONIN, ED: Troponin i, poc: 0 ng/mL (ref 0.00–0.08)

## 2018-04-14 LAB — TROPONIN I: Troponin I: <0.03 ng/mL (ref <0.03)

## 2018-04-14 NOTE — ED Triage Notes (Signed)
Epigastric pain onset approx 12 noon today, states it has moved down a bit and he took some antacid with minimal relief.

## 2018-04-14 NOTE — ED Provider Notes (Signed)
[facility] EMERGENCY DEPARTMENT Provider Note   CSN: [id number] Arrival date & time: 04/13/18  2336     History   Chief Complaint Chief Complaint  Patient presents with  . Chest Pain    HPI Derek Jones is a 47 y.o. male.  Patient is a 47 year old male with no significant past medical history.  He presents today for evaluation of chest discomfort.  This started approximately noon today.  He denies any specific injury or trauma he denies that this began while he was physically active or under stress.  He describes it as a "air bubble" that feels trapped in his chest.  The pain has been intermittent throughout the day and not related with exertion.  He denies any shortness of breath, but does state that the sensation is worse when he breathes.  He denies any radiation to the arm or jaw.  He denies any nausea or diaphoresis.  He denies any recent exertional symptoms.  He has no cardiac risk factors.  The history is provided by the patient.  Chest Pain   This is a new problem. Episode onset: 12 noon. Episode frequency: Intermittently. The problem has not changed since onset.The pain is present in the substernal region. The pain is mild. The quality of the pain is described as pressure-like. The pain does not radiate.    Past Medical History:  Diagnosis Date  . Chronic knee pain   . Chronic neck pain   . ED (erectile dysfunction)   . Kidney stone   . Low back pain     Patient Active Problem List   Diagnosis Date Noted  . Knee pain, left 02/25/2014  . Cervical strain 02/25/2014  . Ganglion cyst 06/01/2011    Past Surgical History:  Procedure Laterality Date  . SHOULDER SURGERY          Home Medications    Prior to Admission medications   Medication Sig Start Date End Date Taking? Authorizing Provider  dexamethasone (DECADRON) 4 MG tablet Take 1 tablet (4 mg total) by mouth 2 (two) times daily with a meal. 06/06/17   Ivery Quale, PA-C  diclofenac (VOLTAREN) 75 MG  EC tablet Take 1 tablet (75 mg total) by mouth 2 (two) times daily. 06/06/17   Ivery Quale, PA-C  meloxicam (MOBIC) 7.5 MG tablet Take 1 tablet (7.5 mg total) by mouth daily. 09/07/17   Vickki Hearing, MD  methocarbamol (ROBAXIN) 500 MG tablet Take 1 tablet (500 mg total) by mouth 3 (three) times daily. 06/06/17   Ivery Quale, PA-C  tiZANidine (ZANAFLEX) 4 MG tablet Take 1 tablet (4 mg total) by mouth every 6 (six) hours as needed for muscle spasms. 09/07/17   Vickki Hearing, MD    Family History Family History  Problem Relation Age of Onset  . Arthritis Unknown   . Cancer Unknown   . Diabetes Mother   . Stroke Maternal Grandmother   . Hypertension Maternal Grandmother     Social History Social History   Tobacco Use  . Smoking status: Never Smoker  . Smokeless tobacco: Never Used  Substance Use Topics  . Alcohol use: Yes    Comment: 3 beers a week  . Drug use: No     Allergies   Codeine and Hydrocodone   Review of Systems Review of Systems  Cardiovascular: Positive for chest pain.  All other systems reviewed and are negative.    Physical Exam Updated Vital Signs BP (!) 120/95 (BP Location: Right Arm)  Pulse 64   Temp 98.7 F (37.1 C) (Oral)   Resp 17   Ht 5\' 9"  (1.753 m)   Wt 98 kg (216 lb)   SpO2 100%   BMI 31.90 kg/m   Physical Exam  Constitutional: He is oriented to person, place, and time. He appears well-developed and well-nourished. No distress.  HENT:  Head: Normocephalic and atraumatic.  Mouth/Throat: Oropharynx is clear and moist.  Neck: Normal range of motion. Neck supple.  Cardiovascular: Normal rate and regular rhythm. Exam reveals no friction rub.  No murmur heard. Pulmonary/Chest: Effort normal and breath sounds normal. No respiratory distress. He has no wheezes. He has no rales.  Abdominal: Soft. Bowel sounds are normal. He exhibits no distension. There is no tenderness.  Musculoskeletal: Normal range of motion. He exhibits no  edema.       Right lower leg: Normal. He exhibits no tenderness and no edema.       Left lower leg: Normal. He exhibits no tenderness and no edema.  Neurological: He is alert and oriented to person, place, and time. Coordination normal.  Skin: Skin is warm and dry. He is not diaphoretic.  Nursing note and vitals reviewed.    ED Treatments / Results  Labs (all labs ordered are listed, but only abnormal results are displayed) Labs Reviewed  COMPREHENSIVE METABOLIC PANEL - Abnormal; Notable for the following components:      Result Value   Glucose, Bld 107 (*)    ALT 16 (*)    All other components within normal limits  CBC WITH DIFFERENTIAL/PLATELET  TROPONIN I  I-STAT TROPONIN, ED    EKG EKG Interpretation  Date/Time:  Friday April 14 2018 00:14:41 EDT Ventricular Rate:  61 PR Interval:  158 QRS Duration: 86 QT Interval:  414 QTC Calculation: 416 R Axis:   18 Text Interpretation:  Normal sinus rhythm Normal ECG Confirmed by Geoffery LyonseLo, Naavya Postma ([id number]) on 04/14/2018 2:51:03 AM   Radiology Dg Chest 2 View  Result Date: 04/14/2018 CLINICAL DATA:  Mid to low chest pain for 1.5 hours. Shortness of breath. EXAM: CHEST - 2 VIEW COMPARISON:  01/01/2016 FINDINGS: New linear infiltration or atelectasis in the right lung base. Lungs are otherwise clear. No blunting of costophrenic angles. No pneumothorax. Heart size and pulmonary vascularity are normal. Lungs are clear and expanded. IMPRESSION: New linear infiltration or atelectasis in the right lung base. Electronically Signed   By: Burman NievesWilliam  Stevens M.D.   On: 04/14/2018 01:12    Procedures Procedures (including critical care time)  Medications Ordered in ED Medications - No data to display   Initial Impression / Assessment and Plan / ED Course  I have reviewed the triage vital signs and the nursing notes.  Pertinent labs & imaging results that were available during my care of the patient were reviewed by me and considered in my  medical decision making (see chart for details).  Patient presents here with chest discomfort that seems noncardiac in nature.  I suspect more of a musculoskeletal etiology.  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  His work-up reveals negative troponin x2 and a normal EKG.  He has no cardiac risk factors and atypical symptoms.  I feel as though he is appropriate for discharge.  He will be advised to take an anti-inflammatory and follow-up with cardiology if his symptoms persist.  Final Clinical Impressions(s) / ED Diagnoses   Final diagnoses:  None  ED Discharge Orders    None       Geoffery Lyons, MD 04/14/18 [phone]

## 2018-04-14 NOTE — Discharge Instructions (Addendum)
Ibuprofen 600 mg 3 times daily for the next 5 days.  Rest.  Follow-up with cardiology if your symptoms persist.  The contact information for the [facility] cardiology clinic has been provided in this discharge summary for you to call and make these arrangements.

## 2018-05-08 DIAGNOSIS — Z6831 Body mass index (BMI) 31.0-31.9, adult: Secondary | ICD-10-CM | POA: Diagnosis not present

## 2018-05-08 DIAGNOSIS — G4700 Insomnia, unspecified: Secondary | ICD-10-CM | POA: Diagnosis not present

## 2018-05-08 DIAGNOSIS — M25541 Pain in joints of right hand: Secondary | ICD-10-CM | POA: Diagnosis not present

## 2018-05-08 DIAGNOSIS — M25542 Pain in joints of left hand: Secondary | ICD-10-CM | POA: Diagnosis not present

## 2018-05-22 DIAGNOSIS — E782 Mixed hyperlipidemia: Secondary | ICD-10-CM | POA: Diagnosis not present

## 2018-06-01 DIAGNOSIS — Z0000 Encounter for general adult medical examination without abnormal findings: Secondary | ICD-10-CM | POA: Diagnosis not present

## 2018-06-01 DIAGNOSIS — Z6830 Body mass index (BMI) 30.0-30.9, adult: Secondary | ICD-10-CM | POA: Diagnosis not present

## 2018-06-01 DIAGNOSIS — E782 Mixed hyperlipidemia: Secondary | ICD-10-CM | POA: Diagnosis not present

## 2018-12-15 DIAGNOSIS — Z125 Encounter for screening for malignant neoplasm of prostate: Secondary | ICD-10-CM | POA: Diagnosis not present

## 2018-12-15 DIAGNOSIS — Z6831 Body mass index (BMI) 31.0-31.9, adult: Secondary | ICD-10-CM | POA: Diagnosis not present

## 2018-12-15 DIAGNOSIS — E782 Mixed hyperlipidemia: Secondary | ICD-10-CM | POA: Diagnosis not present

## 2019-01-06 DIAGNOSIS — M7711 Lateral epicondylitis, right elbow: Secondary | ICD-10-CM | POA: Diagnosis not present

## 2019-01-06 DIAGNOSIS — E782 Mixed hyperlipidemia: Secondary | ICD-10-CM | POA: Diagnosis not present

## 2019-04-04 DIAGNOSIS — E782 Mixed hyperlipidemia: Secondary | ICD-10-CM | POA: Diagnosis not present

## 2019-04-04 DIAGNOSIS — R7301 Impaired fasting glucose: Secondary | ICD-10-CM | POA: Diagnosis not present

## 2019-04-04 DIAGNOSIS — Z6831 Body mass index (BMI) 31.0-31.9, adult: Secondary | ICD-10-CM | POA: Diagnosis not present

## 2019-04-07 DIAGNOSIS — M542 Cervicalgia: Secondary | ICD-10-CM | POA: Diagnosis not present

## 2019-04-07 DIAGNOSIS — E782 Mixed hyperlipidemia: Secondary | ICD-10-CM | POA: Diagnosis not present

## 2019-04-07 DIAGNOSIS — R21 Rash and other nonspecific skin eruption: Secondary | ICD-10-CM | POA: Diagnosis not present

## 2019-04-07 DIAGNOSIS — R7301 Impaired fasting glucose: Secondary | ICD-10-CM | POA: Diagnosis not present

## 2019-05-21 DIAGNOSIS — E782 Mixed hyperlipidemia: Secondary | ICD-10-CM | POA: Diagnosis not present

## 2019-05-21 DIAGNOSIS — Z20828 Contact with and (suspected) exposure to other viral communicable diseases: Secondary | ICD-10-CM | POA: Diagnosis not present

## 2019-05-21 DIAGNOSIS — G4700 Insomnia, unspecified: Secondary | ICD-10-CM | POA: Diagnosis not present

## 2019-05-21 DIAGNOSIS — M25541 Pain in joints of right hand: Secondary | ICD-10-CM | POA: Diagnosis not present

## 2019-05-21 DIAGNOSIS — M25542 Pain in joints of left hand: Secondary | ICD-10-CM | POA: Diagnosis not present

## 2019-05-28 DIAGNOSIS — R509 Fever, unspecified: Secondary | ICD-10-CM | POA: Diagnosis not present

## 2019-05-28 DIAGNOSIS — M25542 Pain in joints of left hand: Secondary | ICD-10-CM | POA: Diagnosis not present

## 2019-05-28 DIAGNOSIS — E782 Mixed hyperlipidemia: Secondary | ICD-10-CM | POA: Diagnosis not present

## 2019-05-28 DIAGNOSIS — M25541 Pain in joints of right hand: Secondary | ICD-10-CM | POA: Diagnosis not present

## 2019-05-28 DIAGNOSIS — G4700 Insomnia, unspecified: Secondary | ICD-10-CM | POA: Diagnosis not present

## 2019-06-06 DIAGNOSIS — H8110 Benign paroxysmal vertigo, unspecified ear: Secondary | ICD-10-CM | POA: Diagnosis not present

## 2019-06-06 DIAGNOSIS — R21 Rash and other nonspecific skin eruption: Secondary | ICD-10-CM | POA: Diagnosis not present

## 2019-07-31 ENCOUNTER — Other Ambulatory Visit: Payer: Self-pay | Admitting: *Deleted

## 2019-07-31 DIAGNOSIS — Z20822 Contact with and (suspected) exposure to covid-19: Secondary | ICD-10-CM

## 2019-08-01 LAB — NOVEL CORONAVIRUS, NAA: SARS-CoV-2, NAA: NOT DETECTED

## 2019-10-17 DIAGNOSIS — Z20828 Contact with and (suspected) exposure to other viral communicable diseases: Secondary | ICD-10-CM | POA: Diagnosis not present

## 2019-10-17 DIAGNOSIS — R202 Paresthesia of skin: Secondary | ICD-10-CM | POA: Diagnosis not present

## 2019-10-17 DIAGNOSIS — G4700 Insomnia, unspecified: Secondary | ICD-10-CM | POA: Diagnosis not present

## 2019-10-17 DIAGNOSIS — R21 Rash and other nonspecific skin eruption: Secondary | ICD-10-CM | POA: Diagnosis not present

## 2019-10-17 DIAGNOSIS — M542 Cervicalgia: Secondary | ICD-10-CM | POA: Diagnosis not present

## 2020-01-03 DIAGNOSIS — J069 Acute upper respiratory infection, unspecified: Secondary | ICD-10-CM | POA: Diagnosis not present

## 2020-04-23 DIAGNOSIS — J069 Acute upper respiratory infection, unspecified: Secondary | ICD-10-CM | POA: Diagnosis not present

## 2020-06-07 ENCOUNTER — Emergency Department (HOSPITAL_COMMUNITY): Payer: BC Managed Care – PPO

## 2020-06-07 ENCOUNTER — Emergency Department (HOSPITAL_COMMUNITY)
Admission: EM | Admit: 2020-06-07 | Discharge: 2020-06-07 | Disposition: A | Discharge status: Home / Self Care | Payer: BC Managed Care – PPO | Attending: Emergency Medicine | Admitting: Emergency Medicine

## 2020-06-07 ENCOUNTER — Other Ambulatory Visit: Payer: Self-pay

## 2020-06-07 DIAGNOSIS — R0789 Other chest pain: Secondary | ICD-10-CM | POA: Diagnosis not present

## 2020-06-07 DIAGNOSIS — J40 Bronchitis, not specified as acute or chronic: Secondary | ICD-10-CM

## 2020-06-07 DIAGNOSIS — R042 Hemoptysis: Secondary | ICD-10-CM | POA: Diagnosis not present

## 2020-06-07 DIAGNOSIS — R05 Cough: Secondary | ICD-10-CM | POA: Diagnosis not present

## 2020-06-07 LAB — CBC WITH DIFFERENTIAL/PLATELET
Abs Immature Granulocytes: 0 10*3/uL (ref 0.00–0.07)
Basophils Absolute: 0 10*3/uL (ref 0.0–0.1)
Basophils Relative: 0 %
Eosinophils Absolute: 0.1 10*3/uL (ref 0.0–0.5)
Eosinophils Relative: 1 %
HCT: 41.7 % (ref 39.0–52.0)
Hemoglobin: 14 g/dL (ref 13.0–17.0)
Immature Granulocytes: 0 %
Lymphocytes Relative: 35 %
Lymphs Abs: 2.1 10*3/uL (ref 0.7–4.0)
MCH: 30.4 pg (ref 26.0–34.0)
MCHC: 33.6 g/dL (ref 30.0–36.0)
MCV: 90.5 fL (ref 80.0–100.0)
Monocytes Absolute: 0.5 10*3/uL (ref 0.1–1.0)
Monocytes Relative: 8 %
Neutro Abs: 3.3 10*3/uL (ref 1.7–7.7)
Neutrophils Relative %: 56 %
Platelets: 217 10*3/uL (ref 150–400)
RBC: 4.61 MIL/uL (ref 4.22–5.81)
RDW: 13.1 % (ref 11.5–15.5)
WBC: 6 10*3/uL (ref 4.0–10.5)
nRBC: 0 % (ref 0.0–0.2)

## 2020-06-07 MED ORDER — AZITHROMYCIN 250 MG PO TABS: ORAL_TABLET | ORAL | 0 refills | Status: DC

## 2020-06-07 NOTE — Discharge Instructions (Addendum)
Your chest x-ray today was clear.  Your blood work and EKG was also reassuring, and your symptoms are most likely related to bronchitis.  It's important to take the antibiotic as directed until its finished.  Follow-up with your primary doctor for recheck.

## 2020-06-07 NOTE — ED Triage Notes (Signed)
Reports coughing up blood since last night   Hx covid - 3/21  Headache

## 2020-06-07 NOTE — ED Provider Notes (Signed)
[facility] EMERGENCY DEPARTMENT Provider Note   CSN: [id number] Arrival date & time: 06/07/20  1143     History Chief Complaint  Patient presents with  . Cough    Derek Jones is a 49 y.o. male.  HPI      Derek Jones is a 49 y.o. male who presents to the Emergency Department complaining of intermittent episodes of coughing that is productive of mucus and bright red blood.  Symptoms began one day ago.  Associated with tightness of left upper chest with coughing only and nasal congestion.  He states the bloody mucus has gradually improved today and now mucus appears "pink" in color.  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 He denies shortness of breath, fever, chills.  He was diagnosed with covid in March, and  feels that his symptoms completely resolved.     Past Medical History:  Diagnosis Date  . Chronic knee pain   . Chronic neck pain   . ED (erectile dysfunction)   . Kidney stone   . Low back pain     Patient Active Problem List   Diagnosis Date Noted  . Knee pain, left 02/25/2014  . Cervical strain 02/25/2014  . Ganglion cyst 06/01/2011    Past Surgical History:  Procedure Laterality Date  . SHOULDER SURGERY         Family History  Problem Relation Age of Onset  . Arthritis Unknown   . Cancer Unknown   . Diabetes Mother   . Stroke Maternal Grandmother   . Hypertension Maternal Grandmother     Social History   Tobacco Use  . Smoking status: Never Smoker  . Smokeless tobacco: Never Used  Vaping Use  . Vaping Use: Never used  Substance Use Topics  . Alcohol use: Yes    Comment: 3 beers a week  . Drug use: No    Home Medications Prior to Admission medications   Not on File    Allergies    Codeine and Hydrocodone  Review of Systems   Review of Systems  Constitutional: Negative for appetite change, chills and fever.  HENT: Positive for congestion. Negative for sore throat and  trouble swallowing.   Respiratory: Positive for cough and chest tightness. Negative for shortness of breath and wheezing.   Cardiovascular: Positive for chest pain. Negative for palpitations and leg swelling.  Gastrointestinal: Negative for abdominal pain, nausea and vomiting.  Genitourinary: Negative for dysuria.  Musculoskeletal: Negative for arthralgias and neck pain.  Skin: Negative for rash.  Neurological: Negative for dizziness, weakness and numbness.  Hematological: Negative for adenopathy.    Physical Exam Updated Vital Signs BP (!) 150/101 (BP Location: Right Arm)   Pulse 80   Temp 99 F (37.2 C) (Oral)   Resp 18   Ht 5\' 10"  (1.778 m)   Wt 94.2 kg   SpO2 98%   BMI 29.79 kg/m   Physical Exam Vitals and nursing note reviewed.  Constitutional:      General: He is not in acute distress.    Appearance: Normal appearance. He is well-developed. He is not ill-appearing or toxic-appearing.  HENT:     Head: Atraumatic.     Right Ear: Tympanic membrane and ear canal normal.     Left Ear: Tympanic membrane and ear canal normal.     Nose: No rhinorrhea.     Mouth/Throat:     Mouth: Mucous membranes are moist.  Pharynx: Oropharynx is clear. Uvula midline. No oropharyngeal exudate or posterior oropharyngeal erythema.  Neck:     Trachea: Phonation normal.  Cardiovascular:     Rate and Rhythm: Normal rate and regular rhythm.     Pulses: Normal pulses.  Pulmonary:     Effort: Pulmonary effort is normal. No respiratory distress.     Breath sounds: Normal breath sounds. No stridor. No wheezing or rales.  Chest:     Chest wall: No tenderness.  Abdominal:     General: There is no distension.     Palpations: Abdomen is soft.     Tenderness: There is no abdominal tenderness.  Musculoskeletal:        General: Normal range of motion.     Cervical back: Full passive range of motion without pain, normal range of motion and neck supple.  Lymphadenopathy:     Cervical: No  cervical adenopathy.  Skin:    General: Skin is warm and dry.  Neurological:     Mental Status: He is alert and oriented to person, place, and time.     Sensory: No sensory deficit.     Motor: No weakness or abnormal muscle tone.     ED Results / Procedures / Treatments   Labs (all labs ordered are listed, but only abnormal results are displayed) Labs Reviewed  CBC WITH DIFFERENTIAL/PLATELET    EKG EKG Interpretation  Date/Time:  Saturday June 07 2020 12:16:13 EDT Ventricular Rate:  73 PR Interval:    QRS Duration: 93 QT Interval:  379 QTC Calculation: 415 R Axis:   16 Text Interpretation: Sinus rhythm Ventricular premature complex Low voltage, precordial leads Borderline T abnormalities, diffuse leads No STEMI Confirmed by Octaviano Glow [phone]) on 06/07/2020 2:47:31 PM   Radiology DG Chest Portable 1 View  Result Date: 06/07/2020 CLINICAL DATA:  Hemoptysis since last night. EXAM: PORTABLE CHEST 1 VIEW COMPARISON:  04/14/2018 FINDINGS: Lungs are adequately inflated and otherwise clear. Cardiomediastinal silhouette, bones and soft tissues are normal. IMPRESSION: No active disease. Electronically Signed   By: Marin Olp M.D.   On: 06/07/2020 12:55    Procedures Procedures (including critical care time)  Medications Ordered in ED Medications - No data to display  ED Course  I have reviewed the triage vital signs and the nursing notes.  Pertinent labs & imaging results that were available during my care of the patient were reviewed by me and considered in my medical decision making (see chart for details).    MDM Rules/Calculators/A&P                          Pt here with productive cough with reported hemoptysis, improving since onset. Chest tightness associated with cough.  No acute EKG changes.  Doubt cardiac process.   Vitals reassuring.   PERC negative.   Likely bronchitis.  Will tx with abx, pt to f/u with PCP.  Return precautions discussed.  Final Clinical  Impression(s) / ED Diagnoses Final diagnoses:  Bronchitis    Rx / DC Orders ED Discharge Orders    None       Kem Parkinson, PA-C 06/07/20 2244    Wyvonnia Dusky, MD 06/08/20 [phone]

## 2020-07-28 DIAGNOSIS — M79672 Pain in left foot: Secondary | ICD-10-CM | POA: Diagnosis not present

## 2020-07-28 DIAGNOSIS — M722 Plantar fascial fibromatosis: Secondary | ICD-10-CM | POA: Diagnosis not present

## 2020-09-08 DIAGNOSIS — M722 Plantar fascial fibromatosis: Secondary | ICD-10-CM | POA: Diagnosis not present

## 2020-09-08 DIAGNOSIS — M79672 Pain in left foot: Secondary | ICD-10-CM | POA: Diagnosis not present

## 2020-10-16 DIAGNOSIS — M25562 Pain in left knee: Secondary | ICD-10-CM | POA: Diagnosis not present

## 2020-10-30 ENCOUNTER — Other Ambulatory Visit: Payer: Self-pay

## 2020-10-30 ENCOUNTER — Ambulatory Visit: Payer: BC Managed Care – PPO

## 2020-10-30 ENCOUNTER — Encounter: Payer: Self-pay | Admitting: Orthopedic Surgery

## 2020-10-30 ENCOUNTER — Ambulatory Visit: Payer: BC Managed Care – PPO | Admitting: Orthopedic Surgery

## 2020-10-30 VITALS — BP 153/104 | HR 80 | Ht 70.0 in | Wt 206.0 lb

## 2020-10-30 DIAGNOSIS — G8929 Other chronic pain: Secondary | ICD-10-CM

## 2020-10-30 DIAGNOSIS — M25562 Pain in left knee: Secondary | ICD-10-CM

## 2020-10-30 DIAGNOSIS — T148XXA Other injury of unspecified body region, initial encounter: Secondary | ICD-10-CM
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
## 2021-04-16 ENCOUNTER — Other Ambulatory Visit: Payer: Self-pay

## 2021-04-16 ENCOUNTER — Telehealth: Payer: Self-pay | Admitting: Orthopedic Surgery

## 2021-04-16 ENCOUNTER — Encounter: Payer: Self-pay | Admitting: Emergency Medicine

## 2021-04-16 ENCOUNTER — Ambulatory Visit
Admission: EM | Admit: 2021-04-16 | Discharge: 2021-04-16 | Disposition: A | Discharge status: Home / Self Care | Payer: BC Managed Care – PPO | Attending: Family Medicine | Admitting: Family Medicine

## 2021-04-16 ENCOUNTER — Ambulatory Visit (HOSPITAL_COMMUNITY)
Admission: RE | Admit: 2021-04-16 | Discharge: 2021-04-16 | Disposition: A | Discharge status: Home / Self Care | Payer: BC Managed Care – PPO | Source: Ambulatory Visit | Attending: Family Medicine | Admitting: Family Medicine

## 2021-04-16 DIAGNOSIS — S6991XA Unspecified injury of right wrist, hand and finger(s), initial encounter: Secondary | ICD-10-CM

## 2021-04-16 DIAGNOSIS — M79641 Pain in right hand: Secondary | ICD-10-CM | POA: Insufficient documentation

## 2021-04-16 MED ORDER — DICLOFENAC SODIUM 50 MG PO TBEC: 50.0000 mg | DELAYED_RELEASE_TABLET | Freq: Two times a day (BID) | ORAL | 0 refills | Status: DC

## 2021-04-16 NOTE — Discharge Instructions (Addendum)
We will notify you of your imaging results once I receive them.  If a fracture is present, you will need to follow-up with hand specialist list on your discharge paperwork.
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
## 2021-04-16 NOTE — ED Triage Notes (Signed)
Pt fell yesterday, now having swelling and pain to top of rt hand/ knuckle area.

## 2021-04-23 DIAGNOSIS — S60221A Contusion of right hand, initial encounter: Secondary | ICD-10-CM | POA: Diagnosis not present

## 2021-06-01 DIAGNOSIS — S3210XA Unspecified fracture of sacrum, initial encounter for closed fracture: Secondary | ICD-10-CM | POA: Diagnosis not present

## 2021-06-01 DIAGNOSIS — M545 Low back pain, unspecified: Secondary | ICD-10-CM | POA: Diagnosis not present

## 2021-06-01 DIAGNOSIS — S3991XA Unspecified injury of abdomen, initial encounter: Secondary | ICD-10-CM | POA: Diagnosis not present

## 2021-06-01 DIAGNOSIS — S60812A Abrasion of left wrist, initial encounter: Secondary | ICD-10-CM | POA: Diagnosis not present

## 2021-06-01 DIAGNOSIS — R0902 Hypoxemia: Secondary | ICD-10-CM | POA: Diagnosis not present

## 2021-06-01 DIAGNOSIS — S3993XA Unspecified injury of pelvis, initial encounter: Secondary | ICD-10-CM | POA: Diagnosis not present

## 2021-06-01 DIAGNOSIS — R079 Chest pain, unspecified: Secondary | ICD-10-CM | POA: Diagnosis not present

## 2021-06-01 DIAGNOSIS — Z20822 Contact with and (suspected) exposure to covid-19: Secondary | ICD-10-CM | POA: Diagnosis not present

## 2021-06-01 DIAGNOSIS — S3219XA Other fracture of sacrum, initial encounter for closed fracture: Secondary | ICD-10-CM | POA: Diagnosis not present

## 2021-06-01 DIAGNOSIS — S299XXA Unspecified injury of thorax, initial encounter: Secondary | ICD-10-CM | POA: Diagnosis not present

## 2021-06-01 DIAGNOSIS — S99912A Unspecified injury of left ankle, initial encounter: Secondary | ICD-10-CM | POA: Diagnosis not present

## 2021-06-01 DIAGNOSIS — Z5181 Encounter for therapeutic drug level monitoring: Secondary | ICD-10-CM | POA: Diagnosis not present

## 2021-06-01 DIAGNOSIS — M25532 Pain in left wrist: Secondary | ICD-10-CM | POA: Diagnosis not present

## 2021-06-01 DIAGNOSIS — M549 Dorsalgia, unspecified: Secondary | ICD-10-CM | POA: Diagnosis not present

## 2021-06-01 DIAGNOSIS — S199XXA Unspecified injury of neck, initial encounter: Secondary | ICD-10-CM | POA: Diagnosis not present

## 2021-06-01 DIAGNOSIS — S322XXA Fracture of coccyx, initial encounter for closed fracture: Secondary | ICD-10-CM | POA: Diagnosis not present

## 2021-06-01 DIAGNOSIS — S0990XA Unspecified injury of head, initial encounter: Secondary | ICD-10-CM | POA: Diagnosis not present

## 2021-06-01 DIAGNOSIS — R0789 Other chest pain: Secondary | ICD-10-CM | POA: Diagnosis not present

## 2021-06-01 DIAGNOSIS — S90512A Abrasion, left ankle, initial encounter: Secondary | ICD-10-CM | POA: Diagnosis not present

## 2021-06-01 DIAGNOSIS — Y33XXXA Other specified events, undetermined intent, initial encounter: Secondary | ICD-10-CM | POA: Diagnosis not present

## 2021-06-10 ENCOUNTER — Telehealth: Payer: Self-pay | Admitting: Orthopedic Surgery

## 2021-06-10 ENCOUNTER — Other Ambulatory Visit: Payer: Self-pay | Admitting: Radiology
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
## 2021-06-11 DIAGNOSIS — S322XXA Fracture of coccyx, initial encounter for closed fracture: Secondary | ICD-10-CM | POA: Diagnosis not present

## 2021-06-11 DIAGNOSIS — R0781 Pleurodynia: Secondary | ICD-10-CM | POA: Diagnosis not present

## 2021-06-11 DIAGNOSIS — M25512 Pain in left shoulder: Secondary | ICD-10-CM | POA: Diagnosis not present

## 2021-06-11 NOTE — Telephone Encounter (Signed)
As noted, appointment pending Xray CD from [facility] in progress for request through digital system "[employer]". Patient aware of status. Notes received (Ph [phone] / fax [phone])

## 2021-06-17 ENCOUNTER — Ambulatory Visit: Payer: BC Managed Care – PPO | Admitting: Orthopedic Surgery

## 2021-06-17 ENCOUNTER — Encounter: Payer: Self-pay | Admitting: Orthopedic Surgery

## 2021-06-17 ENCOUNTER — Other Ambulatory Visit: Payer: Self-pay

## 2021-06-17 VITALS — BP 133/93 | HR 78 | Ht 69.5 in | Wt 213.0 lb

## 2021-06-17 DIAGNOSIS — S3210XA Unspecified fracture of sacrum, initial encounter for closed fracture: Secondary | ICD-10-CM | POA: Diagnosis not present
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
## 2021-06-17 NOTE — Telephone Encounter (Signed)
Done. Patient seen today 06/17/21 with notes/reports. Film requested by patient as well as our office following up on the digital request.

## 2021-07-20 ENCOUNTER — Ambulatory Visit (INDEPENDENT_AMBULATORY_CARE_PROVIDER_SITE_OTHER): Payer: BC Managed Care – PPO | Admitting: Orthopedic Surgery

## 2021-07-20 ENCOUNTER — Ambulatory Visit: Payer: BC Managed Care – PPO

## 2021-07-20 ENCOUNTER — Encounter: Payer: Self-pay | Admitting: Orthopedic Surgery

## 2021-07-20 ENCOUNTER — Other Ambulatory Visit: Payer: Self-pay

## 2021-07-20 DIAGNOSIS — S3210XA Unspecified fracture of sacrum, initial encounter for closed fracture: Secondary | ICD-10-CM

## 2021-07-20 NOTE — Progress Notes (Signed)
Chief Complaint  Patient presents with   sacrum    DOI 06/01/21 It bothers me some, but not as bad as it before. I don't sit directly on it cause it makes me feel like I have to use the bath.   Oow sept 1st   Seeing neurosurgery today (numbness in his buttocks)   BM and urination normal   F/u prn

## 2021-08-06 DIAGNOSIS — S3210XA Unspecified fracture of sacrum, initial encounter for closed fracture: Secondary | ICD-10-CM | POA: Diagnosis not present

## 2021-08-06 DIAGNOSIS — R030 Elevated blood-pressure reading, without diagnosis of hypertension: Secondary | ICD-10-CM | POA: Insufficient documentation

## 2021-08-13 DIAGNOSIS — S3210XA Unspecified fracture of sacrum, initial encounter for closed fracture: Secondary | ICD-10-CM | POA: Diagnosis not present

## 2021-08-13 DIAGNOSIS — R102 Pelvic and perineal pain: Secondary | ICD-10-CM | POA: Diagnosis not present

## 2021-08-27 DIAGNOSIS — Z6831 Body mass index (BMI) 31.0-31.9, adult: Secondary | ICD-10-CM | POA: Diagnosis not present

## 2021-08-27 DIAGNOSIS — R030 Elevated blood-pressure reading, without diagnosis of hypertension: Secondary | ICD-10-CM | POA: Diagnosis not present

## 2021-08-27 DIAGNOSIS — S3210XA Unspecified fracture of sacrum, initial encounter for closed fracture: Secondary | ICD-10-CM | POA: Diagnosis not present

## 2021-11-16 ENCOUNTER — Other Ambulatory Visit: Payer: Self-pay

## 2021-11-16 ENCOUNTER — Ambulatory Visit: Payer: BC Managed Care – PPO

## 2021-11-16 ENCOUNTER — Ambulatory Visit: Payer: BC Managed Care – PPO | Admitting: Orthopedic Surgery

## 2021-11-16 ENCOUNTER — Encounter: Payer: Self-pay | Admitting: Orthopedic Surgery

## 2021-11-16 VITALS — BP 143/100 | HR 68 | Ht 69.5 in | Wt 213.0 lb

## 2021-11-16 DIAGNOSIS — G8929 Other chronic pain: Secondary | ICD-10-CM | POA: Diagnosis not present

## 2021-11-16 DIAGNOSIS — M25562 Pain in left knee: Secondary | ICD-10-CM | POA: Diagnosis not present

## 2021-11-16 DIAGNOSIS — S3210XA Unspecified fracture of sacrum, initial encounter for closed fracture: Secondary | ICD-10-CM | POA: Insufficient documentation

## 2021-11-16 DIAGNOSIS — M25462 Effusion, left knee: Secondary | ICD-10-CM | POA: Diagnosis not present

## 2021-11-16 MED ORDER — PREDNISONE 10 MG PO TABS: 10.0000 mg | ORAL_TABLET | Freq: Two times a day (BID) | ORAL | 0 refills | Status: DC

## 2021-11-16 NOTE — Patient Instructions (Addendum)
You have received an injection of steroids into the joint. 15% of patients will have increased pain within the 24 hours postinjection.   This is transient and will go away.   We recommend that you use ice packs on the injection site for 20 minutes every 2 hours and extra strength Tylenol 2 tablets every 8 as needed until the pain resolves.  If you continue to have pain after taking the Tylenol and using the ice please call the office for further instructions.   Ice 30 min 2 x a day for next 2 weeks

## 2021-11-16 NOTE — Progress Notes (Signed)
Chief Complaint  Patient presents with   Knee Pain    Left     Derek Jones is a 50 years old he comes in with a 2-week history of pain and swelling in his left knee and trouble bending and pain when he walks on it  He denies any trauma  Review of systems is currently negative for any numbness or tingling in his leg or rash   Past Medical History:  Diagnosis Date   Chronic knee pain    Chronic neck pain    ED (erectile dysfunction)    Kidney stone    Low back pain    Past Surgical History:  Procedure Laterality Date   SHOULDER SURGERY     BP (!) 143/100   Pulse 68   Ht 5' 9.5" (1.765 m)   Wt 213 lb (96.6 kg)   BMI 31.00 kg/m   General normal development grooming and hygiene  Psychiatric mood and affect normal alert and oriented x3  Neurologic normal sensation  Vascular normal pulse and perfusion without edema  Musculoskeletal He has a flexion contracture in the knee and it is worse on the left than the right he can only bend it to 100 degrees with pain there is a large effusion he is tender on the medial and lateral joint line without ligamentous instability muscle tone is normal  X-rays show mild arthritic changes in the joint with effusion  Recommend aspiration injection  The patient gave consent for aspiration injection left knee  We put the knee in extension we used a suprapatellar approach laterally cleaned with alcohol sprayed with ethyl chloride and then aspirated about 30 cc of clear yellow fluid injected 40 mg of Depo-Medrol and 3 cc of 1% lidocaine  Put him on prednisone 10 mg twice daily    Meds ordered this encounter  Medications   predniSONE (DELTASONE) 10 MG tablet    Sig: Take 1 tablet (10 mg total) by mouth 2 (two) times daily with a meal for 14 days.    Dispense:  28 tablet    Refill:  0   Follow-up in 2 weeks  Meds ordered this encounter  Medications   predniSONE (DELTASONE) 10 MG tablet    Sig: Take 1 tablet (10 mg total) by mouth 2  (two) times daily with a meal for 14 days.    Dispense:  28 tablet    Refill:  0

## 2021-11-30 ENCOUNTER — Other Ambulatory Visit: Payer: Self-pay

## 2021-11-30 ENCOUNTER — Ambulatory Visit: Payer: BC Managed Care – PPO | Admitting: Orthopedic Surgery

## 2021-11-30 ENCOUNTER — Encounter: Payer: Self-pay | Admitting: Orthopedic Surgery

## 2021-11-30 VITALS — Ht 70.0 in | Wt 210.0 lb

## 2021-11-30 DIAGNOSIS — M25462 Effusion, left knee: Secondary | ICD-10-CM | POA: Diagnosis not present

## 2021-11-30 DIAGNOSIS — G8929 Other chronic pain: Secondary | ICD-10-CM | POA: Diagnosis not present

## 2021-11-30 DIAGNOSIS — M25562 Pain in left knee: Secondary | ICD-10-CM | POA: Diagnosis not present

## 2021-11-30 NOTE — Progress Notes (Signed)
Chief Complaint  Patient presents with   Knee Pain    Left      Derek Jones had a 2-week history of knee pain and swelling when we saw him last on 28 November we x-rayed his knee arthritis with an effusion we recommended an aspiration put him on prednisone 10 mg twice a day we aspirated 30 cc of clear yellow fluid he tolerated that well and is now asymptomatic  His exam is benign  He is released regarding his need to follow-up with [place] as needed  Encounter Diagnoses  Name Primary?   Chronic pain of left knee Yes   Effusion of knee joint, left

## 2022-01-07 DIAGNOSIS — K5900 Constipation, unspecified: Secondary | ICD-10-CM | POA: Diagnosis not present

## 2022-01-07 DIAGNOSIS — M545 Low back pain, unspecified: Secondary | ICD-10-CM | POA: Insufficient documentation

## 2022-01-11 DIAGNOSIS — E782 Mixed hyperlipidemia: Secondary | ICD-10-CM | POA: Insufficient documentation

## 2022-01-11 DIAGNOSIS — R7301 Impaired fasting glucose: Secondary | ICD-10-CM | POA: Insufficient documentation

## 2022-01-12 DIAGNOSIS — R7301 Impaired fasting glucose: Secondary | ICD-10-CM | POA: Diagnosis not present

## 2022-01-12 DIAGNOSIS — E782 Mixed hyperlipidemia: Secondary | ICD-10-CM | POA: Diagnosis not present

## 2022-01-20 ENCOUNTER — Ambulatory Visit: Payer: BC Managed Care – PPO | Admitting: Orthopedic Surgery

## 2022-01-20 ENCOUNTER — Other Ambulatory Visit: Payer: Self-pay

## 2022-01-20 ENCOUNTER — Encounter: Payer: Self-pay | Admitting: Orthopedic Surgery

## 2022-01-20 VITALS — Ht 70.0 in | Wt 214.2 lb

## 2022-01-20 DIAGNOSIS — M25462 Effusion, left knee: Secondary | ICD-10-CM | POA: Diagnosis not present

## 2022-01-20 MED ORDER — PREDNISONE 10 MG PO TABS: 10.0000 mg | ORAL_TABLET | Freq: Two times a day (BID) | ORAL | 0 refills | Status: DC

## 2022-01-20 NOTE — Addendum Note (Signed)
Addended by: Recardo Evangelist A on: 01/20/2022 09:47 AM   Modules accepted: Orders

## 2022-01-20 NOTE — Progress Notes (Signed)
Chief Complaint  Patient presents with   Knee Pain    Knee swollen again, maybe not as much, cannot do steps well, decreased ROM, + gave way when had sharp pain.   51 year old male with recurrent effusion left knee.  I been seeing him for this since November 2022 has had aspiration injection prednisone anti-inflammatories which usually give good result however his pain came back the swelling increased he lost motion in his knee and it gave way when he had a sharp pain on the medial side  His prior x-rays show mild arthritis of the joint with bipartite patella most of his pain is on the medial side  Focused orthopedic exam shows he walks with a slight limp  He has a large knee effusion left knee he cannot bend it or straighten it all the way it is tender to palpation with diffuse pain his ligaments are stable his meniscal signs are equivocal  Procedure note injection and aspiration left knee joint  Verbal consent was obtained to aspirate and inject the left knee joint   Timeout was completed to confirm the site of aspiration and injection  An 18-gauge needle was used to aspirate the left knee joint from a suprapatellar lateral approach.  The medications used were 40 mg of Depo-Medrol and 1% lidocaine 3 cc  Anesthesia was provided by ethyl chloride and the skin was prepped with alcohol.  After cleaning the skin with alcohol an 18-gauge needle was used to aspirate the right knee joint.  We obtained 25 cc of fluid CLR YELLOW  We followed this by injection of 40 mg of Depo-Medrol and 3 cc 1% lidocaine.  There were no complications. A sterile bandage was applied.   Encounter Diagnosis  Name Primary?   Effusion of knee joint, left Yes    ORDER MRI  Needs an MRI now because of the recurrent effusions he may have a loose body or meniscal tear  We will put him on some anti-inflammatories  I did aspirate the knee and got back fluid again.  I will call him with the results  Meds  ordered this encounter  Medications   predniSONE (DELTASONE) 10 MG tablet    Sig: Take 1 tablet (10 mg total) by mouth 2 (two) times daily with a meal.    Dispense:  60 tablet    Refill:  0

## 2022-01-21 DIAGNOSIS — R0781 Pleurodynia: Secondary | ICD-10-CM | POA: Diagnosis not present

## 2022-01-21 DIAGNOSIS — I10 Essential (primary) hypertension: Secondary | ICD-10-CM | POA: Diagnosis not present

## 2022-01-21 DIAGNOSIS — R7303 Prediabetes: Secondary | ICD-10-CM | POA: Diagnosis not present

## 2022-01-21 DIAGNOSIS — E782 Mixed hyperlipidemia: Secondary | ICD-10-CM | POA: Diagnosis not present

## 2022-02-03 ENCOUNTER — Ambulatory Visit (HOSPITAL_COMMUNITY)
Admission: RE | Admit: 2022-02-03 | Discharge: 2022-02-03 | Disposition: A | Discharge status: Home / Self Care | Payer: BC Managed Care – PPO | Source: Ambulatory Visit | Attending: Orthopedic Surgery | Admitting: Orthopedic Surgery

## 2022-02-03 ENCOUNTER — Other Ambulatory Visit: Payer: Self-pay

## 2022-02-03 DIAGNOSIS — M25462 Effusion, left knee: Secondary | ICD-10-CM | POA: Diagnosis not present

## 2022-02-03 DIAGNOSIS — M25562 Pain in left knee: Secondary | ICD-10-CM | POA: Diagnosis not present

## 2022-02-10 ENCOUNTER — Telehealth: Payer: Self-pay | Admitting: Orthopedic Surgery

## 2022-02-10 NOTE — Telephone Encounter (Signed)
Message left regarding patient's MRI options are hyaluronic acid injection with continued medical management versus arthroscopy

## 2022-04-22 ENCOUNTER — Ambulatory Visit: Payer: BC Managed Care – PPO | Admitting: Orthopedic Surgery

## 2022-04-23 DIAGNOSIS — I10 Essential (primary) hypertension: Secondary | ICD-10-CM | POA: Diagnosis not present

## 2022-04-23 DIAGNOSIS — R7303 Prediabetes: Secondary | ICD-10-CM | POA: Diagnosis not present

## 2022-04-28 ENCOUNTER — Encounter: Payer: Self-pay | Admitting: Orthopedic Surgery

## 2022-04-28 ENCOUNTER — Ambulatory Visit: Payer: BC Managed Care – PPO | Admitting: Orthopedic Surgery

## 2022-04-28 VITALS — Ht 70.0 in | Wt 207.0 lb

## 2022-04-28 DIAGNOSIS — M25562 Pain in left knee: Secondary | ICD-10-CM | POA: Diagnosis not present

## 2022-04-28 DIAGNOSIS — M23204 Derangement of unspecified medial meniscus due to old tear or injury, left knee: Secondary | ICD-10-CM

## 2022-04-28 DIAGNOSIS — M25462 Effusion, left knee: Secondary | ICD-10-CM | POA: Diagnosis not present

## 2022-04-28 DIAGNOSIS — G8929 Other chronic pain: Secondary | ICD-10-CM

## 2022-04-28 NOTE — Progress Notes (Signed)
Chief Complaint  ?Patient presents with  ? left knee pain  ?  Continued left knee pain with difficulty going up and down stairs and trouble bending. Patient has had MRI. Uses ibuprofen and tylenol with some relief.  ? ? ?HPI: Mr. Genoveva Ill is 51 years old has had recurrent effusions in his left knee we have treated him with multiple aspirations anti-inflammatories and Tylenol.  He still has trouble with going up and down the stairs trouble bending his knee past 90 degrees he also has a flexion contracture in the knee. ? ?Past Medical History:  ?Diagnosis Date  ? Chronic knee pain   ? Chronic neck pain   ? ED (erectile dysfunction)   ? Kidney stone   ? Low back pain   ? ? ?Ht 5\' 10"  (1.778 m)   Wt 207 lb (93.9 kg)   BMI 29.70 kg/m?  ? ? ?General appearance: Well-developed well-nourished no gross deformities ? ?Cardiovascular normal pulse and perfusion normal color without edema ? ?Neurologically no sensation loss or deficits or pathologic reflexes ? ?Psychological: Awake alert and oriented x3 mood and affect normal ? ?Skin no lacerations or ulcerations no nodularity no palpable masses, no erythema or nodularity ? ?Musculoskeletal: Reexamination the left knee shows a 10 degree flexion contracture has pain at 90 degrees of flexion with active range of motion and passive range of motion up to 110.  He does not have ACL instability and his patellofemoral joint definitely clicks at approximately 40 to 90 degrees with pain he also has tenderness along the medial joint line and positive McMurray's ? ?Imaging x-rays previously done show mild arthritis of the joint grade 1 his MRI shows that he has torn medial meniscus ACL cystic degeneration arthritis in all 3 compartments and I have read that MRI ? ?A/P ? ?51 year old male recurrent effusions pain loss of motion left knee ? ?We discussed possible options ? ?I recommended he have surgical intervention arthroscopic medial meniscectomy assessment of cartilage debridement as  needed ? ?He is agreeable ? ?The procedure has been fully reviewed with the patient; The risks and benefits of surgery have been discussed and explained and understood. Alternative treatment has also been reviewed, questions were encouraged and answered. The postoperative plan is also been reviewed. ? ? ?He works at [employer] he will need in a month out of work he will give [place] FMLA papers ?

## 2022-04-28 NOTE — Patient Instructions (Signed)
Your surgery will be at [facility] by Dr Romeo Apple on May 30,2023 ?The hospital will contact you with a preoperative appointment to discuss Anesthesia. The phone number is [phone] ? Please bring your medications with you for the appointment. ?They will tell you the arrival time and medication instructions when you have your preoperative evaluation. ?Do not wear nail polish the day of your surgery and if you take Phentermine you need to stop this medication ONE WEEK prior to your surgery.  ? ? ?

## 2022-04-29 DIAGNOSIS — E1165 Type 2 diabetes mellitus with hyperglycemia: Secondary | ICD-10-CM | POA: Diagnosis not present

## 2022-04-29 DIAGNOSIS — E782 Mixed hyperlipidemia: Secondary | ICD-10-CM | POA: Diagnosis not present

## 2022-04-29 DIAGNOSIS — M25512 Pain in left shoulder: Secondary | ICD-10-CM | POA: Diagnosis not present

## 2022-04-29 DIAGNOSIS — I10 Essential (primary) hypertension: Secondary | ICD-10-CM | POA: Diagnosis not present

## 2022-04-30 ENCOUNTER — Other Ambulatory Visit: Payer: Self-pay

## 2022-04-30 DIAGNOSIS — G8929 Other chronic pain: Secondary | ICD-10-CM

## 2022-04-30 DIAGNOSIS — M25462 Effusion, left knee: Secondary | ICD-10-CM

## 2022-04-30 DIAGNOSIS — Z01818 Encounter for other preprocedural examination: Secondary | ICD-10-CM

## 2022-04-30 DIAGNOSIS — M23204 Derangement of unspecified medial meniscus due to old tear or injury, left knee: Secondary | ICD-10-CM

## 2022-05-11 NOTE — Patient Instructions (Signed)
Your procedure is scheduled on: 05/18/2022  Report to [facility] Main Entrance at     12:10 PM.  Call this number if you have problems the morning of surgery: [phone]   Remember:   Do not Eat or Drink after midnight         No Smoking the morning of surgery  :  Take these medicines the morning of surgery with A SIP OF WATER: none   Do not wear jewelry, make-up or nail polish.  Do not wear lotions, powders, or perfumes. You may wear deodorant.  Do not shave 48 hours prior to surgery. Men may shave face and neck.  Do not bring valuables to the hospital.  Contacts, dentures or bridgework may not be worn into surgery.  Leave suitcase in the car. After surgery it may be brought to your room.  For patients admitted to the hospital, checkout time is 11:00 AM the day of discharge.   Patients discharged the day of surgery will not be allowed to drive home.    Special Instructions: Shower using CHG night before surgery and shower the day of surgery use CHG.  Use special wash - you have one bottle of CHG for all showers.  You should use approximately 1/2 of the bottle for each shower.  How to Use Chlorhexidine for Bathing Chlorhexidine gluconate (CHG) is a germ-killing (antiseptic) solution that is used to clean the skin. It can get rid of the bacteria that normally live on the skin and can keep them away for about 24 hours. To clean your skin with CHG, you may be given: A CHG solution to use in the shower or as part of a sponge bath. A prepackaged cloth that contains CHG. Cleaning your skin with CHG may help lower the risk for infection: While you are staying in the intensive care unit of the hospital. If you have a vascular access, such as a central line, to provide short-term or long-term access to your veins. If you have a catheter to drain urine from your bladder. If you are on a ventilator. A ventilator is a machine that helps you breathe by moving air in and out of your  lungs. After surgery. What are the risks? Risks of using CHG include: A skin reaction. Hearing loss, if CHG gets in your ears and you have a perforated eardrum. Eye injury, if CHG gets in your eyes and is not rinsed out. The CHG product catching fire. Make sure that you avoid smoking and flames after applying CHG to your skin. Do not use CHG: If you have a chlorhexidine allergy or have previously reacted to chlorhexidine. On babies younger than 592 months of age. How to use CHG solution Use CHG only as told by your health care provider, and follow the instructions on the label. Use the full amount of CHG as directed. Usually, this is one bottle. During a shower Follow these steps when using CHG solution during a shower (unless your health care provider gives you different instructions): Start the shower. Use your normal soap and shampoo to wash your face and hair. Turn off the shower or move out of the shower stream. Pour the CHG onto a clean washcloth. Do not use any type of brush or rough-edged sponge. Starting at your neck, lather your body down to your toes. Make sure you follow these instructions: If you will be having surgery, pay special attention to the part of your body where you will be having surgery.  Scrub this area for at least 1 minute. Do not use CHG on your head or face. If the solution gets into your ears or eyes, rinse them well with water. Avoid your genital area. Avoid any areas of skin that have broken skin, cuts, or scrapes. Scrub your back and under your arms. Make sure to wash skin folds. Let the lather sit on your skin for 1-2 minutes or as long as told by your health care provider. Thoroughly rinse your entire body in the shower. Make sure that all body creases and crevices are rinsed well. Dry off with a clean towel. Do not put any substances on your body afterward--such as powder, lotion, or perfume--unless you are told to do so by your health care provider.  Only use lotions that are recommended by the manufacturer. Put on clean clothes or pajamas. If it is the night before your surgery, sleep in clean sheets.  During a sponge bath Follow these steps when using CHG solution during a sponge bath (unless your health care provider gives you different instructions): Use your normal soap and shampoo to wash your face and hair. Pour the CHG onto a clean washcloth. Starting at your neck, lather your body down to your toes. Make sure you follow these instructions: If you will be having surgery, pay special attention to the part of your body where you will be having surgery. Scrub this area for at least 1 minute. Do not use CHG on your head or face. If the solution gets into your ears or eyes, rinse them well with water. Avoid your genital area. Avoid any areas of skin that have broken skin, cuts, or scrapes. Scrub your back and under your arms. Make sure to wash skin folds. Let the lather sit on your skin for 1-2 minutes or as long as told by your health care provider. Using a different clean, wet washcloth, thoroughly rinse your entire body. Make sure that all body creases and crevices are rinsed well. Dry off with a clean towel. Do not put any substances on your body afterward--such as powder, lotion, or perfume--unless you are told to do so by your health care provider. Only use lotions that are recommended by the manufacturer. Put on clean clothes or pajamas. If it is the night before your surgery, sleep in clean sheets. How to use CHG prepackaged cloths Only use CHG cloths as told by your health care provider, and follow the instructions on the label. Use the CHG cloth on clean, dry skin. Do not use the CHG cloth on your head or face unless your health care provider tells you to. When washing with the CHG cloth: Avoid your genital area. Avoid any areas of skin that have broken skin, cuts, or scrapes. Before surgery Follow these steps when using a  CHG cloth to clean before surgery (unless your health care provider gives you different instructions): Using the CHG cloth, vigorously scrub the part of your body where you will be having surgery. Scrub using a back-and-forth motion for 3 minutes. The area on your body should be completely wet with CHG when you are done scrubbing. Do not rinse. Discard the cloth and let the area air-dry. Do not put any substances on the area afterward, such as powder, lotion, or perfume. Put on clean clothes or pajamas. If it is the night before your surgery, sleep in clean sheets.  For general bathing Follow these steps when using CHG cloths for general bathing (unless your health care  provider gives you different instructions). Use a separate CHG cloth for each area of your body. Make sure you wash between any folds of skin and between your fingers and toes. Wash your body in the following order, switching to a new cloth after each step: The front of your neck, shoulders, and chest. Both of your arms, under your arms, and your hands. Your stomach and groin area, avoiding the genitals. Your right leg and foot. Your left leg and foot. The back of your neck, your back, and your buttocks. Do not rinse. Discard the cloth and let the area air-dry. Do not put any substances on your body afterward--such as powder, lotion, or perfume--unless you are told to do so by your health care provider. Only use lotions that are recommended by the manufacturer. Put on clean clothes or pajamas. Contact a health care provider if: Your skin gets irritated after scrubbing. You have questions about using your solution or cloth. You swallow any chlorhexidine. Call your local poison control center ([phone] in the [place] Get help right away if: Your eyes itch badly, or they become very red or swollen. Your skin itches badly and is red or swollen. Your hearing changes. You have trouble seeing. You have swelling or tingling in  your mouth or throat. You have trouble breathing. These symptoms may represent a serious problem that is an emergency. Do not wait to see if the symptoms will go away. Get medical help right away. Call your local emergency services (911 in the [place] Do not drive yourself to the hospital. Summary Chlorhexidine gluconate (CHG) is a germ-killing (antiseptic) solution that is used to clean the skin. Cleaning your skin with CHG may help to lower your risk for infection. You may be given CHG to use for bathing. It may be in a bottle or in a prepackaged cloth to use on your skin. Carefully follow your health care provider's instructions and the instructions on the product label. Do not use CHG if you have a chlorhexidine allergy. Contact your health care provider if your skin gets irritated after scrubbing. This information is not intended to replace advice given to you by your health care provider. Make sure you discuss any questions you have with your health care provider. Document Revised: 02/16/2021 Document Reviewed: 02/16/2021 Elsevier Patient Education  2023 [employer] Knee Arthroscopy, Care After This sheet gives you information about how to care for yourself after your procedure. Your health care provider may also give you more specific instructions. If you have problems or questions, contact your health care provider. What can I expect after the procedure? After the procedure, it is common to have: Soreness. Swelling. Pain. A small amount of fluid from the incisions. Follow these instructions at home: Medicines Take over-the-counter and prescription medicines only as told by your health care provider. Ask your health care provider if the medicine prescribed to you: Requires you to avoid driving or using machinery. Can cause constipation. You may need to take these actions to prevent or treat constipation: Drink enough fluid to keep your urine pale yellow. Take over-the-counter or  prescription medicines. Eat foods that are high in fiber, such as beans, whole grains, and fresh fruits and vegetables. Limit foods that are high in fat and processed sugars, such as fried or sweet foods. If you have a brace or immobilizer: Wear it as told by your health care provider. Remove it only as told by your health care provider. Loosen it if your toes tingle, become  numb, or turn cold and blue. Keep it clean and dry. Bathing Do not take baths, swim, or use a hot tub until your health care provider approves. Ask your health care provider if you may take showers. Keep your bandage (dressing) dry until your health care provider says that it can be removed. If the brace or immobilizer is not waterproof: Do not let it get wet. Cover it with a watertight covering when you take a bath or shower. Incision care  Follow instructions from your health care provider about how to take care of your incisions. Make sure you: Wash your hands with soap and water for at least 20 seconds before and after you change your dressing. If soap and water are not available, use hand sanitizer. Change your dressing as told by your health care provider. Leave stitches (sutures) or adhesive strips in place. These skin closures may need to stay in place for 2 weeks or longer. If adhesive strip edges start to loosen and curl up, you may trim the loose edges. Do not remove adhesive strips completely unless your health care provider tells you to do that. Check your incision areas every day for signs of infection. Check for: Redness. More swelling or pain. Blood or more fluid. Warmth. Pus or a bad smell. Managing pain, stiffness, and swelling  If directed, put ice on the injured area. To do this: If you have a removable brace or immobilizer, remove it as told by your health care provider. Put ice in a plastic bag or use the icing device (cold therapy unit) that you were given. Follow instructions about how to use  the icing device. Place a towel between your skin and the bag or between your skin and the icing device. Leave the ice on for 20 minutes, 2-3 times a day. Remove the ice if your skin turns bright red. This is very important. If you cannot feel pain, heat, or cold, you have a greater risk of damage to the area. Move your toes often to reduce stiffness and swelling. Raise (elevate) the injured area above the level of your heart while you are sitting or lying down. Activity Do not use your knee to support your body weight until your health care provider says that you can. Follow weight-bearing restrictions as told. Use crutches or other devices to help you move around (assistive devices) as told by your health care provider. Ask your health care provider what activities are safe for you during recovery, and what activities you need to avoid. If physical therapy was prescribed, do exercises as told by your health care provider. Doing exercises may help improve knee movement, range of motion, and flexibility. Do not lift anything that is heavier than 10 lb (4.5 kg), or the limit that you are told, until your health care provider says that it is safe. General instructions Do not drive until your health care provider approves. You may be able to drive after 1-3 weeks. Do not use any products that contain nicotine or tobacco, such as cigarettes, e-cigarettes, and chewing tobacco. These can delay incision or bone healing after surgery. If you need help quitting, ask your health care provider. Wear compression stockings as told by your health care provider. These stockings help to prevent blood clots and reduce swelling in your legs. Keep all follow-up visits. This is important. Contact a health care provider if: You have any of these signs of infection: Redness or more pain around an incision. Blood or more fluid  coming from an incision. Warmth coming from an incision. Pus or a bad smell coming from an  incision. More swelling in your knee. A fever or chills. You have severe knee pain, and medicine does not help. An incision opens up. Get help right away if: You have trouble breathing or shortness of breath. You have chest pain. You develop pain or swelling in your lower leg or at the back of your knee. You have numbness or tingling in your lower leg or your foot. You notice that your foot or toes look darker than normal or are cooler than normal. These symptoms may represent a serious problem that is an emergency. Do not wait to see if the symptoms will go away. Get medical help right away. Call your local emergency services (911 in the [place] Do not drive yourself to the hospital. Summary To help relieve pain and swelling, put ice on the injured area for 20 minutes, 2-3 times a day. Raise (elevate) the injured area above the level of your heart while you are sitting or lying down. If physical therapy was prescribed, do exercises as told by your health care provider. Exercises may help improve range of motion. This information is not intended to replace advice given to you by your health care provider. Make sure you discuss any questions you have with your health care provider. Document Revised: 04/07/2020 Document Reviewed: 04/07/2020 Elsevier Patient Education  2023 Else[employer] Inc. General Anesthesia, Adult, Care After This sheet gives you information about how to care for yourself after your procedure. Your health care provider may also give you more specific instructions. If you have problems or questions, contact your health care provider. What can I expect after the procedure? After the procedure, the following side effects are common: Pain or discomfort at the IV site. Nausea. Vomiting. Sore throat. Trouble concentrating. Feeling cold or chills. Feeling weak or tired. Sleepiness and fatigue. Soreness and body aches. These side effects can affect parts of the body that were not  involved in surgery. Follow these instructions at home: For the time period you were told by your health care provider:  Rest. Do not participate in activities where you could fall or become injured. Do not drive or use machinery. Do not drink alcohol. Do not take sleeping pills or medicines that cause drowsiness. Do not make important decisions or sign legal documents. Do not take care of children on your own. Eating and drinking Follow any instructions from your health care provider about eating or drinking restrictions. When you feel hungry, start by eating small amounts of foods that are soft and easy to digest (bland), such as toast. Gradually return to your regular diet. Drink enough fluid to keep your urine pale yellow. If you vomit, rehydrate by drinking water, juice, or clear broth. General instructions If you have sleep apnea, surgery and certain medicines can increase your risk for breathing problems. Follow instructions from your health care provider about wearing your sleep device: Anytime you are sleeping, including during daytime naps. While taking prescription pain medicines, sleeping medicines, or medicines that make you drowsy. Have a responsible adult stay with you for the time you are told. It is important to have someone help care for you until you are awake and alert. Return to your normal activities as told by your health care provider. Ask your health care provider what activities are safe for you. Take over-the-counter and prescription medicines only as told by your health care provider. If you smoke,  do not smoke without supervision. Keep all follow-up visits as told by your health care provider. This is important. Contact a health care provider if: You have nausea or vomiting that does not get better with medicine. You cannot eat or drink without vomiting. You have pain that does not get better with medicine. You are unable to pass urine. You develop a skin  rash. You have a fever. You have redness around your IV site that gets worse. Get help right away if: You have difficulty breathing. You have chest pain. You have blood in your urine or stool, or you vomit blood. Summary After the procedure, it is common to have a sore throat or nausea. It is also common to feel tired. Have a responsible adult stay with you for the time you are told. It is important to have someone help care for you until you are awake and alert. When you feel hungry, start by eating small amounts of foods that are soft and easy to digest (bland), such as toast. Gradually return to your regular diet. Drink enough fluid to keep your urine pale yellow. Return to your normal activities as told by your health care provider. Ask your health care provider what activities are safe for you. This information is not intended to replace advice given to you by your health care provider. Make sure you discuss any questions you have with your health care provider. Document Revised: 08/21/2020 Document Reviewed: 03/20/2020 Elsevier Patient Education  2023 [employer].

## 2022-05-13 ENCOUNTER — Encounter (HOSPITAL_COMMUNITY): Payer: Self-pay

## 2022-05-13 ENCOUNTER — Encounter (HOSPITAL_COMMUNITY)
Admission: RE | Admit: 2022-05-13 | Discharge: 2022-05-13 | Disposition: A | Discharge status: Home / Self Care | Payer: BC Managed Care – PPO | Source: Ambulatory Visit | Attending: Orthopedic Surgery | Admitting: Orthopedic Surgery

## 2022-05-13 DIAGNOSIS — Z01818 Encounter for other preprocedural examination: Secondary | ICD-10-CM | POA: Insufficient documentation

## 2022-05-13 HISTORY — DX: Essential (primary) hypertension: I10

## 2022-05-13 HISTORY — DX: Unspecified osteoarthritis, unspecified site: M19.90

## 2022-05-13 HISTORY — DX: Type 2 diabetes mellitus without complications: E11.9

## 2022-05-13 HISTORY — DX: Personal history of urinary calculi: Z87.442

## 2022-05-13 LAB — CBC
HCT: 42.6 % (ref 39.0–52.0)
Hemoglobin: 14.3 g/dL (ref 13.0–17.0)
MCH: 30.9 pg (ref 26.0–34.0)
MCHC: 33.6 g/dL (ref 30.0–36.0)
MCV: 92 fL (ref 80.0–100.0)
Platelets: 213 10*3/uL (ref 150–400)
RBC: 4.63 MIL/uL (ref 4.22–5.81)
RDW: 13 % (ref 11.5–15.5)
WBC: 6.5 10*3/uL (ref 4.0–10.5)
nRBC: 0 % (ref 0.0–0.2)

## 2022-05-13 LAB — BASIC METABOLIC PANEL
Anion gap: 5 (ref 5–15)
BUN: 16 mg/dL (ref 6–20)
CO2: 27 mmol/L (ref 22–32)
Calcium: 9.2 mg/dL (ref 8.9–10.3)
Chloride: 107 mmol/L (ref 98–111)
Creatinine, Ser: 1.1 mg/dL (ref 0.61–1.24)
GFR, Estimated: >60 mL/min (ref >60)
Glucose, Bld: 99 mg/dL (ref 70–99)
Potassium: 3.9 mmol/L (ref 3.5–5.1)
Sodium: 139 mmol/L (ref 135–145)

## 2022-05-18 ENCOUNTER — Ambulatory Visit (HOSPITAL_COMMUNITY)
Admission: RE | Admit: 2022-05-18 | Discharge: 2022-05-18 | Disposition: A | Discharge status: Home / Self Care | Payer: BC Managed Care – PPO | Attending: Orthopedic Surgery | Admitting: Orthopedic Surgery

## 2022-05-18 ENCOUNTER — Ambulatory Visit (HOSPITAL_COMMUNITY): Payer: BC Managed Care – PPO | Admitting: Anesthesiology

## 2022-05-18 ENCOUNTER — Encounter: Payer: Self-pay | Admitting: Orthopedic Surgery

## 2022-05-18 ENCOUNTER — Other Ambulatory Visit: Payer: Self-pay

## 2022-05-18 ENCOUNTER — Encounter (HOSPITAL_COMMUNITY)
Admission: RE | Disposition: A | Discharge status: Home / Self Care | Payer: Self-pay | Source: Home / Self Care | Attending: Orthopedic Surgery

## 2022-05-18 ENCOUNTER — Encounter (HOSPITAL_COMMUNITY): Payer: Self-pay | Admitting: Orthopedic Surgery

## 2022-05-18 DIAGNOSIS — M2242 Chondromalacia patellae, left knee: Secondary | ICD-10-CM | POA: Insufficient documentation

## 2022-05-18 DIAGNOSIS — I10 Essential (primary) hypertension: Secondary | ICD-10-CM | POA: Insufficient documentation

## 2022-05-18 DIAGNOSIS — E119 Type 2 diabetes mellitus without complications: Secondary | ICD-10-CM | POA: Insufficient documentation

## 2022-05-18 DIAGNOSIS — M2342 Loose body in knee, left knee: Secondary | ICD-10-CM | POA: Diagnosis not present

## 2022-05-18 HISTORY — PX: KNEE ARTHROSCOPY WITH MEDIAL MENISECTOMY: SHX5651

## 2022-05-18 LAB — GLUCOSE, CAPILLARY
Glucose-Capillary: 111 mg/dL — ABNORMAL HIGH (ref 70–99)
Glucose-Capillary: 138 mg/dL — ABNORMAL HIGH (ref 70–99)

## 2022-05-18 SURGERY — ARTHROSCOPY, KNEE, WITH MEDIAL MENISCECTOMY
Anesthesia: General | Site: Knee | Laterality: Left

## 2022-05-18 MED ORDER — PROPOFOL 10 MG/ML IV BOLUS
INTRAVENOUS | Status: DC | PRN
  Administered 2022-05-18: 200 mg via INTRAVENOUS

## 2022-05-18 MED ORDER — IBUPROFEN 800 MG PO TABS: 800.0000 mg | ORAL_TABLET | Freq: Three times a day (TID) | ORAL | 1 refills | Status: AC | PRN

## 2022-05-18 MED ORDER — CHLORHEXIDINE GLUCONATE 0.12 % MT SOLN
15.0000 mL | Freq: Once | OROMUCOSAL | Status: AC
Start: 2022-05-18 — End: 2022-05-18
  Administered 2022-05-18: 15 mL via OROMUCOSAL

## 2022-05-18 MED ORDER — FENTANYL CITRATE (PF) 100 MCG/2ML IJ SOLN
INTRAMUSCULAR | Status: DC | PRN
  Administered 2022-05-18 (×2): 25 ug via INTRAVENOUS
  Administered 2022-05-18: 50 ug via INTRAVENOUS

## 2022-05-18 MED ORDER — DEXAMETHASONE SODIUM PHOSPHATE 10 MG/ML IJ SOLN
INTRAMUSCULAR | Status: AC
  Filled 2022-05-18: qty 1

## 2022-05-18 MED ORDER — LACTATED RINGERS IV SOLN
INTRAVENOUS | Status: DC | PRN
  Administered 2022-05-18: 1000 mL via INTRAVENOUS

## 2022-05-18 MED ORDER — IBUPROFEN 800 MG PO TABS
800.0000 mg | ORAL_TABLET | Freq: Once | ORAL | Status: AC
  Administered 2022-05-18: 800 mg via ORAL
  Filled 2022-05-18: qty 1

## 2022-05-18 MED ORDER — EPHEDRINE SULFATE-NACL 50-0.9 MG/10ML-% IV SOSY
PREFILLED_SYRINGE | INTRAVENOUS | Status: DC | PRN
  Administered 2022-05-18 (×2): 10 mg via INTRAVENOUS
  Administered 2022-05-18: 5 mg via INTRAVENOUS

## 2022-05-18 MED ORDER — MIDAZOLAM HCL 5 MG/5ML IJ SOLN
INTRAMUSCULAR | Status: DC | PRN
  Administered 2022-05-18: 2 mg via INTRAVENOUS

## 2022-05-18 MED ORDER — FENTANYL CITRATE (PF) 100 MCG/2ML IJ SOLN
INTRAMUSCULAR | Status: AC
  Filled 2022-05-18: qty 2

## 2022-05-18 MED ORDER — PHENYLEPHRINE 80 MCG/ML (10ML) SYRINGE FOR IV PUSH (FOR BLOOD PRESSURE SUPPORT)
PREFILLED_SYRINGE | INTRAVENOUS | Status: DC | PRN
  Administered 2022-05-18 (×6): 80 ug via INTRAVENOUS
  Administered 2022-05-18 (×4): 160 ug via INTRAVENOUS

## 2022-05-18 MED ORDER — DEXAMETHASONE SODIUM PHOSPHATE 4 MG/ML IJ SOLN
INTRAMUSCULAR | Status: DC | PRN
  Administered 2022-05-18: 4 mg via INTRAVENOUS

## 2022-05-18 MED ORDER — SODIUM CHLORIDE 0.9 % IR SOLN
Status: DC | PRN
  Administered 2022-05-18: 500 mL

## 2022-05-18 MED ORDER — OXYCODONE HCL 5 MG PO TABS
5.0000 mg | ORAL_TABLET | Freq: Once | ORAL | Status: AC
  Administered 2022-05-18: 5 mg via ORAL
  Filled 2022-05-18: qty 1

## 2022-05-18 MED ORDER — ONDANSETRON HCL 4 MG/2ML IJ SOLN
INTRAMUSCULAR | Status: AC
  Filled 2022-05-18: qty 2

## 2022-05-18 MED ORDER — BUPIVACAINE-EPINEPHRINE (PF) 0.5% -1:200000 IJ SOLN
INTRAMUSCULAR | Status: AC
  Filled 2022-05-18: qty 30

## 2022-05-18 MED ORDER — SODIUM CHLORIDE 0.9 % IR SOLN: Status: DC | PRN

## 2022-05-18 MED ORDER — GLYCOPYRROLATE 0.2 MG/ML IJ SOLN
INTRAMUSCULAR | Status: DC | PRN
  Administered 2022-05-18: .2 mg via INTRAVENOUS

## 2022-05-18 MED ORDER — LACTATED RINGERS IV SOLN
INTRAVENOUS | Status: DC
Start: 2022-05-18 — End: 2022-05-18

## 2022-05-18 MED ORDER — LIDOCAINE HCL (CARDIAC) PF 100 MG/5ML IV SOSY
PREFILLED_SYRINGE | INTRAVENOUS | Status: DC | PRN
  Administered 2022-05-18: 60 mg via INTRAVENOUS

## 2022-05-18 MED ORDER — MIDAZOLAM HCL 2 MG/2ML IJ SOLN
INTRAMUSCULAR | Status: AC
  Filled 2022-05-18: qty 2

## 2022-05-18 MED ORDER — ORAL CARE MOUTH RINSE
15.0000 mL | Freq: Once | OROMUCOSAL | Status: AC
Start: 2022-05-18 — End: 2022-05-18

## 2022-05-18 MED ORDER — ONDANSETRON HCL 4 MG/2ML IJ SOLN
INTRAMUSCULAR | Status: DC | PRN
  Administered 2022-05-18: 4 mg via INTRAVENOUS

## 2022-05-18 MED ORDER — LIDOCAINE HCL (PF) 2 % IJ SOLN
INTRAMUSCULAR | Status: AC
  Filled 2022-05-18: qty 5

## 2022-05-18 MED ORDER — PROPOFOL 10 MG/ML IV BOLUS
INTRAVENOUS | Status: AC
  Filled 2022-05-18: qty 20

## 2022-05-18 MED ORDER — CEFAZOLIN SODIUM-DEXTROSE 2-4 GM/100ML-% IV SOLN
2.0000 g | Freq: Once | INTRAVENOUS | Status: AC
  Administered 2022-05-18: 2 g via INTRAVENOUS
  Filled 2022-05-18: qty 100

## 2022-05-18 MED ORDER — OXYCODONE-ACETAMINOPHEN 5-325 MG PO TABS: 1.0000 | ORAL_TABLET | ORAL | 0 refills | Status: DC | PRN

## 2022-05-18 MED ORDER — EPINEPHRINE PF 1 MG/ML IJ SOLN
INTRAMUSCULAR | Status: AC
  Filled 2022-05-18: qty 4

## 2022-05-18 MED ORDER — BUPIVACAINE-EPINEPHRINE (PF) 0.5% -1:200000 IJ SOLN
INTRAMUSCULAR | Status: DC | PRN
  Administered 2022-05-18: 30 mL

## 2022-05-18 MED ORDER — PHENYLEPHRINE 80 MCG/ML (10ML) SYRINGE FOR IV PUSH (FOR BLOOD PRESSURE SUPPORT)
PREFILLED_SYRINGE | INTRAVENOUS | Status: AC
  Filled 2022-05-18: qty 10

## 2022-05-18 SURGICAL SUPPLY — 46 items
APL PRP STRL LF DISP 70% ISPRP (MISCELLANEOUS) ×1
BAG HAMPER (MISCELLANEOUS) ×2 IMPLANT
BLADE SHAVER TORPEDO 4X13 (MISCELLANEOUS) ×1 IMPLANT
BLADE SURG SZ11 CARB STEEL (BLADE) ×2 IMPLANT
BNDG CMPR STD VLCR NS LF 5.8X6 (GAUZE/BANDAGES/DRESSINGS) ×1
BNDG ELASTIC 6X5.8 VLCR NS LF (GAUZE/BANDAGES/DRESSINGS) ×2 IMPLANT
CHLORAPREP W/TINT 26 (MISCELLANEOUS) ×2 IMPLANT
CLOTH BEACON ORANGE TIMEOUT ST (SAFETY) ×2 IMPLANT
COOLER ICEMAN CLASSIC (MISCELLANEOUS) ×2 IMPLANT
GAUZE 4X4 16PLY ~~LOC~~+RFID DBL (SPONGE) ×2 IMPLANT
GAUZE SPONGE 4X4 12PLY STRL (GAUZE/BANDAGES/DRESSINGS) ×2 IMPLANT
GAUZE XEROFORM 5X9 LF (GAUZE/BANDAGES/DRESSINGS) ×2 IMPLANT
GLOVE BIOGEL PI IND STRL 7.0 (GLOVE) ×2 IMPLANT
GLOVE BIOGEL PI IND STRL 8.5 (GLOVE) IMPLANT
GLOVE BIOGEL PI INDICATOR 7.0 (GLOVE) ×2
GLOVE BIOGEL PI INDICATOR 8.5 (GLOVE) ×1
GLOVE SKINSENSE NS SZ8.0 LF (GLOVE) ×1
GLOVE SKINSENSE STRL SZ8.0 LF (GLOVE) IMPLANT
GOWN STRL REUS W/TWL LRG LVL3 (GOWN DISPOSABLE) ×2 IMPLANT
GOWN STRL REUS W/TWL XL LVL3 (GOWN DISPOSABLE) ×2 IMPLANT
IV NS IRRIG 3000ML ARTHROMATIC (IV SOLUTION) ×5 IMPLANT
KIT BLADEGUARD II DBL (SET/KITS/TRAYS/PACK) ×2 IMPLANT
KIT TURNOVER CYSTO (KITS) ×2 IMPLANT
MANIFOLD NEPTUNE II (INSTRUMENTS) ×2 IMPLANT
MARKER SKIN DUAL TIP RULER LAB (MISCELLANEOUS) ×2 IMPLANT
NDL HYPO 18GX1.5 BLUNT FILL (NEEDLE) IMPLANT
NDL HYPO 21X1.5 SAFETY (NEEDLE) ×1 IMPLANT
NDL SPNL 18GX3.5 QUINCKE PK (NEEDLE) ×1 IMPLANT
NEEDLE HYPO 18GX1.5 BLUNT FILL (NEEDLE) ×2 IMPLANT
NEEDLE HYPO 21X1.5 SAFETY (NEEDLE) ×2 IMPLANT
NEEDLE SPNL 18GX3.5 QUINCKE PK (NEEDLE) ×2 IMPLANT
NS IRRIG 500ML POUR BTL (IV SOLUTION) ×1 IMPLANT
PACK ARTHRO LIMB DRAPE STRL (MISCELLANEOUS) ×2 IMPLANT
PAD ABD 5X9 TENDERSORB (GAUZE/BANDAGES/DRESSINGS) ×2 IMPLANT
PAD ARMBOARD 7.5X6 YLW CONV (MISCELLANEOUS) ×2 IMPLANT
PAD COLD SHLDR SM WRAP-ON (PAD) ×1 IMPLANT
PAD FOR LEG HOLDER (MISCELLANEOUS) ×2 IMPLANT
PADDING CAST COTTON 6X4 STRL (CAST SUPPLIES) ×2 IMPLANT
PORT APPOLLO RF 90DEGREE MULTI (SURGICAL WAND) ×1 IMPLANT
SET ARTHROSCOPY INST (INSTRUMENTS) ×2 IMPLANT
SET BASIN LINEN APH (SET/KITS/TRAYS/PACK) ×2 IMPLANT
SUT ETHILON 3 0 FSL (SUTURE) ×2 IMPLANT
SYR 10ML LL (SYRINGE) ×2 IMPLANT
SYR 30ML LL (SYRINGE) ×3 IMPLANT
TUBE CONNECTING 12X1/4 (SUCTIONS) ×4 IMPLANT
TUBING IN/OUT FLOW W/MAIN PUMP (TUBING) ×2 IMPLANT

## 2022-05-18 NOTE — Anesthesia Postprocedure Evaluation (Signed)
Anesthesia Post Note  Patient: Derek Jones  Procedure(s) Performed: KNEE ARTHROSCOPY WITH REMOVAL OF LOOSE BODY (Left: Knee)  Patient location during evaluation: Phase II Anesthesia Type: General Level of consciousness: awake Pain management: pain level controlled Vital Signs Assessment: post-procedure vital signs reviewed and stable Respiratory status: spontaneous breathing and respiratory function stable Cardiovascular status: blood pressure returned to baseline and stable Postop Assessment: no headache and no apparent nausea or vomiting Anesthetic complications: no Comments: Late entry   No notable events documented.   Last Vitals:  Vitals:   05/18/22 1100 05/18/22 1113  BP: 125/89 120/89  Pulse: 94 98  Resp: 20 16  Temp:  36.7 C  SpO2: 94% 93%    Last Pain:  Vitals:   05/18/22 1113  TempSrc: Oral  PainSc: 6                  Windell Norfolk

## 2022-05-18 NOTE — Brief Op Note (Signed)
05/18/2022  10:25 AM  PATIENT:  Derek Jones  51 y.o. 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  It did block some of his extension.  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   Delay start of Pharmacological VTE agent (>24hrs) due to surgical blood loss or risk of bleeding: not applicable

## 2022-05-18 NOTE — Transfer of Care (Signed)
Immediate Anesthesia Transfer of Care Note  Patient: Derek Jones  Procedure(s) Performed: KNEE ARTHROSCOPY WITH REMOVAL OF LOOSE BODY (Left: Knee)  Patient Location: PACU  Anesthesia Type:General  Level of Consciousness: awake  Airway & Oxygen Therapy: Patient Spontanous Breathing and Patient connected to nasal cannula oxygen  Post-op Assessment: Report given to RN and Post -op Vital signs reviewed and stable  Post vital signs: Reviewed and stable  Last Vitals:  Vitals Value Taken Time  BP 120/87 05/18/22 1025  Temp    Pulse 101 05/18/22 1027  Resp 15 05/18/22 1027  SpO2 97 % 05/18/22 1027  Vitals shown include unvalidated device data.  Last Pain:  Vitals:   05/18/22 0754  TempSrc:   PainSc: 0-No pain      Patients Stated Pain Goal: 9 (05/18/22 0753)  Complications: No notable events documented.

## 2022-05-18 NOTE — H&P (Signed)
Chief Complaint  Patient presents with   left knee pain      Continued left knee pain with difficulty going up and down stairs and trouble bending. Patient has had MRI. Uses ibuprofen and tylenol with some relief.      HPI: [name] is 51 years old has had recurrent effusions in his left knee we have treated him with multiple aspirations anti-inflammatories and Tylenol.  He still has trouble with going up and down the stairs trouble bending his knee past 90 degrees he also has a flexion contracture in the knee.       Past Medical History:  Diagnosis Date   Chronic knee pain     Chronic neck pain     ED (erectile dysfunction)     Kidney stone     Low back pain      Past Surgical History:  Procedure Laterality Date   SHOULDER SURGERY     Family History  Problem Relation Age of Onset   Arthritis Other    Cancer Other    Diabetes Mother    Stroke Maternal Grandmother    Hypertension Maternal Grandmother    Social History   Tobacco Use   Smoking status: Never   Smokeless tobacco: Never  Vaping Use   Vaping Use: Never used  Substance Use Topics   Alcohol use: Yes    Comment: 3 beers a week   Drug use: No    Current Facility-Administered Medications:    ceFAZolin (ANCEF) IVPB 2g/100 mL premix, 2 g, Intravenous, Once, Vickki Hearing, MD   chlorhexidine (PERIDEX) 0.12 % solution 15 mL, 15 mL, Mouth/Throat, Once **OR** MEDLINE mouth rinse, 15 mL, Mouth Rinse, Once, Kiel, Mosetta Putt, MD   lactated ringers infusion, , Intravenous, Continuous, Kiel, Mosetta Putt, MD  Facility-Administered Medications Ordered in Other Encounters:    lactated ringers infusion, , Intravenous, Continuous PRN, Montez Morita, April W, CRNA, New Bag at 05/18/22 0800    Ht 5\' 10"  (1.778 m)   Wt 207 lb (93.9 kg)   BMI 29.70 kg/m      General appearance: Well-developed well-nourished no gross deformities  Cardiovascular normal pulse and perfusion normal color without edema  Neurologically no  sensation loss or deficits or pathologic reflexes   Psychological: Awake alert and oriented x3 mood and affect normal   Skin no lacerations or ulcerations no nodularity no palpable masses, no erythema or nodularity   Musculoskeletal: Reexamination the left knee shows a 10 degree flexion contracture has pain at 90 degrees of flexion with active range of motion and passive range of motion up to 110.  He does not have ACL instability and his patellofemoral joint definitely clicks at approximately 40 to 90 degrees with pain he also has tenderness along the medial joint line and positive McMurray's   Imaging x-rays previously done show mild arthritis of the joint grade 1 his MRI shows that he has torn medial meniscus ACL cystic degeneration arthritis in all 3 compartments and I have read that MRI   A/P   51 year old male recurrent effusions pain loss of motion left knee   We discussed possible options   I recommended he have surgical intervention arthroscopic medial meniscectomy LEFT KNEE assessment of cartilage debridement as needed   He is agreeable   The procedure has been fully reviewed with the patient; The risks and benefits of surgery have been discussed and explained and understood. Alternative treatment has also been reviewed, questions were encouraged and answered. The postoperative  plan is also been reviewed.     He works at [employer] he will need in a month out of work he will give [place] [employer]

## 2022-05-18 NOTE — Anesthesia Preprocedure Evaluation (Signed)
Anesthesia Evaluation  Patient identified by MRN, date of birth, ID band Patient awake    Reviewed: Allergy & Precautions, H&P , NPO status , Patient's Chart, lab work & pertinent test results, reviewed documented beta blocker date and time   Airway Mallampati: II  TM Distance: >3 FB Neck ROM: full    Dental no notable dental hx.    Pulmonary neg pulmonary ROS,    Pulmonary exam normal breath sounds clear to auscultation       Cardiovascular Exercise Tolerance: Good hypertension, negative cardio ROS   Rhythm:regular Rate:Normal     Neuro/Psych  Neuromuscular disease negative psych ROS   GI/Hepatic negative GI ROS, Neg liver ROS,   Endo/Other  negative endocrine ROSdiabetes  Renal/GU negative Renal ROS  negative genitourinary   Musculoskeletal   Abdominal   Peds  Hematology negative hematology ROS (+)   Anesthesia Other Findings   Reproductive/Obstetrics negative OB ROS                             Anesthesia Physical Anesthesia Plan  ASA: 2  Anesthesia Plan: General and General LMA   Post-op Pain Management:    Induction:   PONV Risk Score and Plan: Ondansetron  Airway Management Planned:   Additional Equipment:   Intra-op Plan:   Post-operative Plan:   Informed Consent: I have reviewed the patients History and Physical, chart, labs and discussed the procedure including the risks, benefits and alternatives for the proposed anesthesia with the patient or authorized representative who has indicated his/her understanding and acceptance.     Dental Advisory Given  Plan Discussed with: CRNA  Anesthesia Plan Comments:         Anesthesia Quick Evaluation

## 2022-05-18 NOTE — Interval H&P Note (Signed)
History and Physical Interval Note:  05/18/2022 8:48 AM  Derek Jones  has presented today for surgery, with the diagnosis of old peripheral tear of medial meniscus of left knee.  The various methods of treatment have been discussed with the patient and family. After consideration of risks, benefits and other options for treatment, the patient has consented to  Procedure(s): KNEE ARTHROSCOPY WITH MEDIAL MENISECTOMY (Left) as a surgical intervention.  The patient's history has been reviewed, patient examined, no change in status, stable for surgery.  I have reviewed the patient's chart and labs.  Questions were answered to the patient's satisfaction.     Fuller Canada

## 2022-05-18 NOTE — Op Note (Signed)
05/18/2022  10:25 AM  PATIENT:  Derek Jones  51 y.o. 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  It did block some of his extension.  Lateral femoral condylar fissure Lateral meniscus intact  Trochlea grade 2 diffuse lesion with mild grade II chondromalacia of the patella as well  The surgery was done in the following manner  The patient was seen in the preop area and confirmed to be Farrel Gordon.  His left knee was confirmed as the surgical site and was marked.  After reviewing his images I cleared him for surgery.  He was taken to the operating for general anesthesia with an LMA  He was prepped and draped sterilely from groin to foot.  Timeout was completed  Standard arthroscopy portal was established with injection of Marcaine with epinephrine into the lateral portal and 11 blade was used to make the portal and the scope was placed into the joint.  We followed with a diagnostic arthroscopy  I have listed the findings above but the main finding was that the old medial meniscal resection showed no evidence of a new tear.  There was also a large cystic structure/loose body at the attachment of the ACL on the tibial side.  This structure had multiple lobules in it and 1 major area which was blocking extension of the knee.  This was probed and found to be definable from the remaining ACL footprint  A spinal needle was  used to place a medial portal and then with a combination of arthroscopic scissors, shaver, 90 degree ArthroCare wand the mass was removed.  The ACL was reduced (tissue from the cyst that was anterior to the ACL fibers was removed until full extension could be reached)  Probing of the ACL found to be stable and intact with no loss of integrity  The joint was irrigated suctioned free and injected with 30 cc of Marcaine with epinephrine  Sterile bandages were applied  Ace bandage and Cryo/Cuff applied  The patient was extubated and taken to recovery room in stable condition  Postop plan  Full weightbearing as tolerated Immediate range of motion as tolerated  No bracing needed    SURGEON:  Surgeon(s) and Role:    * Carole Civil, MD - Primary  PHYSICIAN ASSISTANT:   ASSISTANTS: none   ANESTHESIA:   general  EBL:  none   BLOOD ADMINISTERED:none  DRAINS: none   LOCAL MEDICATIONS USED:  MARCAINE     SPECIMEN:  No Specimen  DISPOSITION OF SPECIMEN:  N/A  COUNTS:  YES  TOURNIQUET:  * No tourniquets in log *  DICTATION: .Dragon Dictation  PLAN OF CARE: Discharge to home after PACU  PATIENT DISPOSITION:  PACU - hemodynamically stable.   Delay start of Pharmacological VTE agent (>24hrs) due to surgical blood loss or risk of bleeding: not applicable

## 2022-05-18 NOTE — Anesthesia Procedure Notes (Signed)
Procedure Name: LMA Insertion Date/Time: 05/18/2022 9:15 AM Performed by: Caren Macadam, CRNA Pre-anesthesia Checklist: Patient identified, Emergency Drugs available, Suction available and Patient being monitored Patient Re-evaluated:Patient Re-evaluated prior to induction Oxygen Delivery Method: Circle system utilized Preoxygenation: Pre-oxygenation with 100% oxygen Induction Type: IV induction Ventilation: Mask ventilation without difficulty LMA: LMA inserted LMA Size: 5.0 Number of attempts: 1 Placement Confirmation: positive ETCO2 and breath sounds checked- equal and bilateral Tube secured with: Tape Dental Injury: Teeth and Oropharynx as per pre-operative assessment

## 2022-05-19 ENCOUNTER — Encounter: Payer: Self-pay | Admitting: *Deleted

## 2022-05-20 ENCOUNTER — Encounter (HOSPITAL_COMMUNITY): Payer: Self-pay | Admitting: Orthopedic Surgery

## 2022-05-25 DIAGNOSIS — Z9889 Other specified postprocedural states: Secondary | ICD-10-CM | POA: Insufficient documentation

## 2022-05-26 ENCOUNTER — Ambulatory Visit (INDEPENDENT_AMBULATORY_CARE_PROVIDER_SITE_OTHER): Payer: BC Managed Care – PPO | Admitting: Orthopedic Surgery

## 2022-05-26 ENCOUNTER — Ambulatory Visit (HOSPITAL_COMMUNITY)
Admission: RE | Admit: 2022-05-26 | Discharge: 2022-05-26 | Disposition: A | Discharge status: Home / Self Care | Payer: BC Managed Care – PPO | Source: Ambulatory Visit | Attending: Orthopedic Surgery | Admitting: Orthopedic Surgery

## 2022-05-26 ENCOUNTER — Encounter: Payer: Self-pay | Admitting: Orthopedic Surgery

## 2022-05-26 ENCOUNTER — Telehealth: Payer: Self-pay | Admitting: Radiology

## 2022-05-26 DIAGNOSIS — M79662 Pain in left lower leg: Secondary | ICD-10-CM

## 2022-05-26 DIAGNOSIS — R600 Localized edema: Secondary | ICD-10-CM | POA: Diagnosis not present

## 2022-05-26 DIAGNOSIS — I82432 Acute embolism and thrombosis of left popliteal vein: Secondary | ICD-10-CM

## 2022-05-26 DIAGNOSIS — I82442 Acute embolism and thrombosis of left tibial vein: Secondary | ICD-10-CM | POA: Diagnosis not present

## 2022-05-26 DIAGNOSIS — Z9889 Other specified postprocedural states: Secondary | ICD-10-CM

## 2022-05-26 MED ORDER — APIXABAN 5 MG PO TABS: ORAL_TABLET | ORAL | 1 refills | Status: DC

## 2022-05-26 NOTE — Patient Instructions (Addendum)
Ice more times a day   Start PT   Continue walker for 1-2   Ultrasound to r/o dvt go today now to [facility] for the scan.

## 2022-05-26 NOTE — Telephone Encounter (Signed)
Patient reached and is on way back to office.

## 2022-05-26 NOTE — Telephone Encounter (Signed)
-----   Message from Doristine Section sent at 05/26/2022 10:44 AM EDT ----- Regarding: PATIENT CAME BACK FROM [facility], RABENOLD [[id number]] has come back (wife in lobby, patient is in car) - please advise of instructions, thanks

## 2022-05-26 NOTE — Progress Notes (Addendum)
Chief Complaint  Patient presents with   Post-op Follow-up    Left knee scope 05/18/22 not weight bearing, using walker states calf is painful    Postop visit #1 postop day #8 status post removal of large loose body from the anterior portion of the joint attached to his ACL tibial insertion  He still has a rather large joint effusion he can flex knee to 95 degrees but only extended to -30 degrees  There are no signs of infection but he does complain of tenderness and pain in his left calf this is noted in the gastrocsoleus area with pain with passive and active plantar and dorsiflexion of the foot  There is no evidence ankle swelling or edema  We are sending him for stat ultrasound.  If the ultrasound is positive we will treat him for DVT if not we will continue to progress with our postop rehab program patient is advised to ice more than once a day.  We advised him 4-5 times a day 30 minutes at a time  I am sending him to formal physical therapy due to the delayed nature of his return to range of motion and full weightbearing.  His next appointment will be in 5 weeks or sooner if there is a DVT found on ultrasound  Other Findings: None.  IMPRESSION: Expansile near occlusive DVT involving the left popliteal vein with occlusive DVT involving both the left posterior tibial and peroneal veins.   Electronically Signed By: Simonne Come M.D. On: 05/26/2022 10:28    PMD: Dwana Melena   Call him to get recommendations for DVT

## 2022-05-26 NOTE — Telephone Encounter (Signed)
Called patient as soon as this message was received - received voice mail, left message accordingly. Also called both of emergency contact phone numbers, brother and significant other Tiffany, and both of their voice messages are full. Continuing to try calling.

## 2022-05-26 NOTE — Telephone Encounter (Signed)
Thanks I let him leave, he needs to come back in on June 19th, can you let him know a time?

## 2022-05-26 NOTE — Addendum Note (Signed)
Addended by: Fuller Canada E on: 05/26/2022 12:30 PM   Modules accepted: Orders

## 2022-05-26 NOTE — Telephone Encounter (Signed)
Come back now, need to put him directly in room

## 2022-05-27 ENCOUNTER — Encounter (HOSPITAL_COMMUNITY): Payer: Self-pay

## 2022-05-27 ENCOUNTER — Ambulatory Visit (HOSPITAL_COMMUNITY): Payer: BC Managed Care – PPO | Attending: Orthopedic Surgery

## 2022-05-27 DIAGNOSIS — R2689 Other abnormalities of gait and mobility: Secondary | ICD-10-CM | POA: Diagnosis not present

## 2022-05-27 DIAGNOSIS — M25562 Pain in left knee: Secondary | ICD-10-CM | POA: Diagnosis not present

## 2022-05-27 DIAGNOSIS — G8929 Other chronic pain: Secondary | ICD-10-CM | POA: Insufficient documentation

## 2022-05-27 NOTE — Therapy (Signed)
OUTPATIENT PHYSICAL THERAPY LOWER EXTREMITY EVALUATION   Patient Name: Derek MaduraCharles N Jones MRN: [id number] DOB:May 27, 1971, 51 y.o., male Today's Date: 05/27/2022   PT End of Session - 05/27/22 1315     Visit Number 1    Number of Visits 12    Date for PT Re-Evaluation 07/08/22    Authorization Type [employer] PPO (no auth, 60v. limit for PT/OT combined)    Progress Note Due on Visit 12    PT Start Time 0115    PT Stop Time 0145    PT Time Calculation (min) 30 min             Past Medical History:  Diagnosis Date   Arthritis    Chronic knee pain    Chronic neck pain    Diabetes mellitus without complication [facility](HCC)    ED (erectile dysfunction)    History of kidney stones    Hypertension    Low back pain    Past Surgical History:  Procedure Laterality Date   KNEE ARTHROSCOPY WITH MEDIAL MENISECTOMY Left 05/18/2022   Procedure: KNEE ARTHROSCOPY WITH REMOVAL OF LOOSE BODY;  Surgeon: Vickki HearingHarrison, Stanley E, MD;  Location: [facility];  Service: Orthopedics;  Laterality: Left;   SHOULDER SURGERY     Patient Active Problem List   Diagnosis Date Noted   S/P left knee arthroscopy 05/18/22 05/25/2022   Loose body in knee, left knee    Impaired fasting glucose 01/11/2022   Mixed hyperlipidemia 01/11/2022   Low back pain 01/07/2022   Sacral fracture, closed (HCC) 11/16/2021   Elevated blood-pressure reading, without diagnosis of hypertension 08/06/2021   Neck pain 09/23/2016   Cervical radiculopathy 03/31/2016   Knee pain, left 02/25/2014   Cervical strain 02/25/2014   Ganglion cyst 06/01/2011    PCP: Nita SellsHall, John MD  REFERRING PROVIDER: Fuller CanadaHarrison, Stanley MD  REFERRING DIAG: [phone] (ICD-10-CM) - S/P left knee arthroscopy   THERAPY DIAG:  Chronic pain of left knee - Plan: PT plan of care cert/re-cert  Other abnormalities of gait and mobility - Plan: PT plan of care cert/re-cert  Rationale for Evaluation and Treatment Rehabilitation  ONSET DATE: 05/18/22  SUBJECTIVE:    SUBJECTIVE STATEMENT: Patient reports history of left knee pain for 3-4 years. Patient was experiencing pain after standing for several hours at work . MRI was done 1 month ago where loose body was seen. KNEE ARTHROSCOPY WITH Left MEDIAL MENISECTOMY on 05/18/22. Patient now presents for PT post surgery. Pt has DVT in left calf, taking blood thinners. Pt reports no initial improvement in knee pain.   PERTINENT HISTORY -Left knee scope 05/18/22 not weight bearing, using walker states calf is painful  -Postop visit #1 postop day #8 status post removal of large loose body from the anterior portion of the joint attached to his ACL tibial insertion  -KNEE ARTHROSCOPY WITH Left MEDIAL MENISECTOMY  -Pre-op diagnosis: old peripheral tear of medial meniscus of left knee Post-op diagnosis: loose body left knee   PAIN:  Are you having pain? Yes: NPRS scale: current 8/10 Pain location: left calf where DVT is Pain description: cramping Aggravating factors: standing or sitting long time. Leg dependent position Relieving factors: none  PRECAUTIONS: Other: DVT  Postop plan   Full weightbearing as tolerated Immediate range of motion as tolerated   No bracing needed  WEIGHT BEARING RESTRICTIONS No  FALLS:  Has patient fallen in last 6 months? No  LIVING ENVIRONMENT: Lives with: lives with their spouse Lives in: Mobile home Stairs: Yes:  External: 10 steps; on left going up Has following equipment at home: Walker - 2 wheeled and Crutches  OCCUPATION: builds tires at [employer]  PLOF: Independent  PATIENT GOALS be able to bend knee better, be able to stand on leg more and maneuver better   OBJECTIVE:   DIAGNOSTIC FINDINGS:  Medial compartment chondromalacia medial femoral condyle grade 2 with old posterior horn medial meniscus tear no new tear in the medial meniscus   Notch at the ACL footprint there was a large loose body still attached to the ACL on the tibial side there were multiple  cysts attached to the structure however it was separate from the intrameniscal ligament and the main fibers of the ACL.  It did block some of his extension.   Lateral femoral condylar fissure Lateral meniscus intact   Trochlea grade 2 diffuse lesion with mild grade II chondromalacia of the patella as well  PATIENT SURVEYS:  FOTO 38  COGNITION:  Overall cognitive status: Within functional limits for tasks assessed     SENSATION: Numbness of right finger tips. Altered around left knee surgery site   EDEMA:  Left knee, anterior medial portion of knee/knee cap PALPATION: TTP left knee around medal left patella  LOWER EXTREMITY ROM:  Active ROM Right eval Left eval  Hip flexion    Hip extension    Hip abduction    Hip adduction    Hip internal rotation    Hip external rotation    Knee flexion  95 degrees Passive 100 degrees  Knee extension  -25degrees    Passive, patient resisted to more extension  Ankle dorsiflexion    Ankle plantarflexion    Ankle inversion    Ankle eversion     (Blank rows = not tested)  LOWER EXTREMITY MMT:  MMT Right eval Left eval  Hip flexion 5 5  Hip extension    Hip abduction 5 5  Hip adduction    Hip internal rotation    Hip external rotation    Knee flexion 5 3  Knee extension 5 3  Ankle dorsiflexion 5 5  Ankle plantarflexion    Ankle inversion 5 5  Ankle eversion 5 5   (Blank rows = not tested)    FUNCTIONAL TESTS:  2 minute walk test: 230feet  GAIT: Distance walked: 246  Assistive device utilized: Crutches Level of assistance: Modified independence Comments: slowed cadence, decreased weightbearing on left LE    TODAY'S TREATMENT: Evaluation Education on knee extension prop, weight shifting in standing    PATIENT EDUCATION:  Education details: Patient educated on exam findings, POC, scope of PT, HEP Person educated: Patient Education method: Programmer, multimedia, Facilities manager, and Handouts Education comprehension:  verbalized understanding, returned demonstration, verbal cues required, and tactile cues required    HOME EXERCISE PROGRAM: Handout at next session  ASSESSMENT:  CLINICAL IMPRESSION: Patient a 51 y.o. y.o. male who was seen today for physical therapy evaluation and treatment for Left KNEE ARTHROSCOPY WITH MEDIAL MENISECTOMY as well as loose body removal.  Patient presents with decreased rom in left knee as well as decreased strength and pain. Patient +for DVT in left calf and is on blood thinner. Patient has not been bearing weight on left LE much since surgery. Patient is walking limited distances and is reliant on crutches. Patient will benefit from skilled PT services to improve strength, rom and functional use of LE.    OBJECTIVE IMPAIRMENTS Abnormal gait, decreased activity tolerance, decreased balance, decreased endurance, decreased mobility, difficulty walking,  decreased ROM, decreased strength, increased edema, impaired flexibility, and pain.   ACTIVITY LIMITATIONS bending, sitting, standing, squatting, stairs, transfers, and locomotion level  PARTICIPATION LIMITATIONS: cleaning, laundry, shopping, community activity, occupation, and yard work  PERSONAL FACTORS Past/current experiences and 3+ comorbidities:     Arthritis, Back pain, Diabetes Type I or II, Headaches, High Blood Pressure, Prior Surgery are also affecting patient's functional outcome.   REHAB POTENTIAL: Good  CLINICAL DECISION MAKING: Stable/uncomplicated  EVALUATION COMPLEXITY: Low   GOALS: Goals reviewed with patient? Yes  SHORT TERM GOALS: Target date: 06/17/2022  Patient will be independent with HEP in order to improve functional outcomes. Baseline:  Goal status: INITIAL  2.  Patient will report at least 25% improvement in symptoms for improved quality of life. Baseline:  Goal status: INITIAL  3.  Patient will be able to ambulate 250' no AD at Independent with equal step length. Baseline: reliance on  crutches at 250'. Without crutches patient minimally bears any weight on left LE and hops off onto right LE as quick as possible.  Goal status: INITIAL   LONG TERM GOALS: Target date: 07/08/2022  Patient will report at least 75% improvement in symptoms for improved quality of life. Baseline:  Goal status: INITIAL  2.  Patient will improve FOTO score by at least 15 points in order to indicate improved tolerance to activity. Baseline: 38 Goal status: INITIAL  3.  Patient will improve left knee extension to >= 4/5 to improve LE use in performing stairs/transfers  Baseline: 3/5 Goal status: INITIAL  4.  Patient will be able to ambulate at least 440' feet in in order to demonstrate improved tolerance to activity. Baseline: 246' Goal status: INITIAL  5.  Patient will improve ROM for left  knee extension/flexion to -10-115 degrees to improve squatting, and other functional mobility. Baseline: -25-95 Goal status: INITIAL    PLAN: PT FREQUENCY: 2x/week  PT DURATION: 6 weeks  PLANNED INTERVENTIONS: Therapeutic exercises, Therapeutic activity, Neuromuscular re-education, Balance training, Gait training, Patient/Family education, Joint manipulation, Joint mobilization, Stair training, DME instructions, Dry Needling, Electrical stimulation, Cryotherapy, Moist heat, Manual lymph drainage, scar mobilization, Taping, and Manual therapy  PLAN FOR NEXT SESSION: initiate HEP including knee flexion and extension, quad strengthening such as, heel slides, hamstring stretch, SAQ, LAQ, bridging, seated knee flexion with scoot forward in chair at end range, weight shifting onto LE, extension prop with icing   Willadean Guyton, PT 05/27/2022, 3:51 PM

## 2022-05-28 NOTE — Telephone Encounter (Signed)
Patient seen back in our clinic on same day, as noted.

## 2022-06-01 ENCOUNTER — Encounter (HOSPITAL_COMMUNITY): Payer: Self-pay | Admitting: Physical Therapy

## 2022-06-01 ENCOUNTER — Ambulatory Visit (HOSPITAL_COMMUNITY): Payer: BC Managed Care – PPO | Attending: Orthopedic Surgery | Admitting: Physical Therapy

## 2022-06-01 DIAGNOSIS — R2689 Other abnormalities of gait and mobility: Secondary | ICD-10-CM | POA: Diagnosis not present

## 2022-06-01 DIAGNOSIS — Z9889 Other specified postprocedural states: Secondary | ICD-10-CM | POA: Insufficient documentation

## 2022-06-01 DIAGNOSIS — G8929 Other chronic pain: Secondary | ICD-10-CM | POA: Diagnosis not present

## 2022-06-01 DIAGNOSIS — M25562 Pain in left knee: Secondary | ICD-10-CM | POA: Diagnosis not present

## 2022-06-01 NOTE — Therapy (Signed)
OUTPATIENT PHYSICAL THERAPY TREATMENT NOTE   Patient Name: Derek Jones MRN: [id number] DOB:09/19/1971, 51 y.o., male Today's Date: 06/01/2022   PT End of Session - 06/01/22 0923     Visit Number 2    Number of Visits 12    Date for PT Re-Evaluation 07/08/22    Authorization Type [employer] PPO (no auth, 60v. limit for PT/OT combined)    Progress Note Due on Visit 12    PT Start Time 0922    PT Stop Time 1000    PT Time Calculation (min) 38 min    Activity Tolerance Patient tolerated treatment well    Behavior During Therapy WFL for tasks assessed/performed             Past Medical History:  Diagnosis Date   Arthritis    Chronic knee pain    Chronic neck pain    Diabetes mellitus without complication [facility](HCC)    ED (erectile dysfunction)    History of kidney stones    Hypertension    Low back pain    Past Surgical History:  Procedure Laterality Date   KNEE ARTHROSCOPY WITH MEDIAL MENISECTOMY Left 05/18/2022   Procedure: KNEE ARTHROSCOPY WITH REMOVAL OF LOOSE BODY;  Surgeon: Vickki HearingHarrison, Stanley E, MD;  Location: [facility];  Service: Orthopedics;  Laterality: Left;   SHOULDER SURGERY     Patient Active Problem List   Diagnosis Date Noted   S/P left knee arthroscopy 05/18/22 05/25/2022   Loose body in knee, left knee    Impaired fasting glucose 01/11/2022   Mixed hyperlipidemia 01/11/2022   Low back pain 01/07/2022   Sacral fracture, closed (HCC) 11/16/2021   Elevated blood-pressure reading, without diagnosis of hypertension 08/06/2021   Neck pain 09/23/2016   Cervical radiculopathy 03/31/2016   Knee pain, left 02/25/2014   Cervical strain 02/25/2014   Ganglion cyst 06/01/2011    PCP: Nita SellsHall, John MD  REFERRING PROVIDER: Fuller CanadaHarrison, Stanley MD  REFERRING DIAG: [phone] (ICD-10-CM) - S/P left knee arthroscopy   THERAPY DIAG:  Chronic pain of left knee  Other abnormalities of gait and mobility  Rationale for Evaluation and Treatment Rehabilitation  ONSET DATE:  05/18/22  SUBJECTIVE:   SUBJECTIVE STATEMENT: Knee is doing pretty good. He has been trying to bend it.   PERTINENT HISTORY -Left knee scope 05/18/22 not weight bearing, using walker states calf is painful  -Postop visit #1 postop day #8 status post removal of large loose body from the anterior portion of the joint attached to his ACL tibial insertion  -KNEE ARTHROSCOPY WITH Left MEDIAL MENISECTOMY  -Pre-op diagnosis: old peripheral tear of medial meniscus of left knee Post-op diagnosis: loose body left knee   PAIN:  Are you having pain? Yes: NPRS scale: 2-3/10 Pain location: knee Pain description: ache Aggravating factors: standing or sitting long time. Leg dependent position Relieving factors: none  PRECAUTIONS: Other: DVT  Postop plan   Full weightbearing as tolerated Immediate range of motion as tolerated   No bracing needed  WEIGHT BEARING RESTRICTIONS No  FALLS:  Has patient fallen in last 6 months? No  LIVING ENVIRONMENT: Lives with: lives with their spouse Lives in: Mobile home Stairs: Yes: External: 10 steps; on left going up Has following equipment at home: Walker - 2 wheeled and Crutches  OCCUPATION: builds tires at [employer]  PLOF: Independent  PATIENT GOALS be able to bend knee better, be able to stand on leg more and maneuver better   OBJECTIVE:   DIAGNOSTIC FINDINGS:  Medial compartment chondromalacia medial femoral condyle grade 2 with old posterior horn medial meniscus tear no new tear in the medial meniscus   Notch at the ACL footprint there was a large loose body still attached to the ACL on the tibial side there were multiple cysts attached to the structure however it was separate from the intrameniscal ligament and the main fibers of the ACL.  It did block some of his extension.   Lateral femoral condylar fissure Lateral meniscus intact   Trochlea grade 2 diffuse lesion with mild grade II chondromalacia of the patella as well  PATIENT  SURVEYS:  FOTO 38  COGNITION:  Overall cognitive status: Within functional limits for tasks assessed     SENSATION: Numbness of right finger tips. Altered around left knee surgery site   EDEMA:  Left knee, anterior medial portion of knee/knee cap PALPATION: TTP left knee around medal left patella  LOWER EXTREMITY ROM:  Active ROM Right eval Left eval Left  06/01/22  Hip flexion     Hip extension     Hip abduction     Hip adduction     Hip internal rotation     Hip external rotation     Knee flexion  95 degrees Passive 100 degrees 96 improves to 106 with heel slides  Knee extension  -25degrees    Passive, patient resisted to more extension Lacking 10   Ankle dorsiflexion     Ankle plantarflexion     Ankle inversion     Ankle eversion      (Blank rows = not tested)  LOWER EXTREMITY MMT:  MMT Right eval Left eval  Hip flexion 5 5  Hip extension    Hip abduction 5 5  Hip adduction    Hip internal rotation    Hip external rotation    Knee flexion 5 3  Knee extension 5 3  Ankle dorsiflexion 5 5  Ankle plantarflexion    Ankle inversion 5 5  Ankle eversion 5 5   (Blank rows = not tested)    FUNCTIONAL TESTS:  2 minute walk test: 29feet  GAIT: Distance walked: 246  Assistive device utilized: Crutches Level of assistance: Modified independence Comments: slowed cadence, decreased weightbearing on left LE    TODAY'S TREATMENT: 06/01/22 Quad set 10x 10 second holds  SLR LLE 2x 10  Heel slides with strap 1x 10 5-10 second holds Supine hamstring isometrics into green ball 10 x 5-10 second holds LAQ 10 x 5 second holds LLE   Evaluation Education on knee extension prop, weight shifting in standing    PATIENT EDUCATION:  Education details: Patient educated on exam findings, POC, scope of PT, HEP 06/01/22 HEP Person educated: Patient Education method: Programmer, multimedia, Demonstration, and Handouts Education comprehension: verbalized understanding, returned  demonstration, verbal cues required, and tactile cues required    HOME EXERCISE PROGRAM: Access Code: [email or web address] Date: 06/01/2022 - Long Sitting Quad Set  - 3 x daily - 7 x weekly - 10 reps - 10 second hold - Active Straight Leg Raise with Quad Set (Mirrored)  - 3 x daily - 7 x weekly - 2 sets - 10 reps - Supine Heel Slide with Strap  - 3 x daily - 7 x weekly - 10 reps - 5-10 second hold - Seated Long Arc Quad  - 3 x daily - 7 x weekly - 10 reps - 5 second hold  ASSESSMENT:  CLINICAL IMPRESSION: Patient demonstrating improvement in L knee extension but continues to lack  TKE. Minimal quad lag with SLR. ROM improves to 106 with heel slides. Adjusted crutch for patient at end of session with improvement in gait mechanics following. Patient will continue to benefit from physical therapy in order to improve function and reduce impairment.    OBJECTIVE IMPAIRMENTS Abnormal gait, decreased activity tolerance, decreased balance, decreased endurance, decreased mobility, difficulty walking, decreased ROM, decreased strength, increased edema, impaired flexibility, and pain.   ACTIVITY LIMITATIONS bending, sitting, standing, squatting, stairs, transfers, and locomotion level  PARTICIPATION LIMITATIONS: cleaning, laundry, shopping, community activity, occupation, and yard work  PERSONAL FACTORS Past/current experiences and 3+ comorbidities:     Arthritis, Back pain, Diabetes Type I or II, Headaches, High Blood Pressure, Prior Surgery are also affecting patient's functional outcome.   REHAB POTENTIAL: Good  CLINICAL DECISION MAKING: Stable/uncomplicated  EVALUATION COMPLEXITY: Low   GOALS: Goals reviewed with patient? Yes  SHORT TERM GOALS: Target date: 06/17/2022  Patient will be independent with HEP in order to improve functional outcomes. Baseline:  Goal status: IN PROGRESS  2.  Patient will report at least 25% improvement in symptoms for improved quality of life. Baseline:  Goal  status: IN PROGRESS  3.  Patient will be able to ambulate 250' no AD at Independent with equal step length. Baseline: reliance on crutches at 250'. Without crutches patient minimally bears any weight on left LE and hops off onto right LE as quick as possible.  Goal status: IN PROGRESS   LONG TERM GOALS: Target date: 07/08/2022  Patient will report at least 75% improvement in symptoms for improved quality of life. Baseline:  Goal status: IN PROGRESS  2.  Patient will improve FOTO score by at least 15 points in order to indicate improved tolerance to activity. Baseline: 38 Goal status: IN PROGRESS  3.  Patient will improve left knee extension to >= 4/5 to improve LE use in performing stairs/transfers  Baseline: 3/5 Goal status: IN PROGRESS  4.  Patient will be able to ambulate at least 440' feet in in order to demonstrate improved tolerance to activity. Baseline: 246' Goal status: IN PROGRESS  5.  Patient will improve ROM for left  knee extension/flexion to -10-115 degrees to improve squatting, and other functional mobility. Baseline: -25-95 Goal status: IN PROGRESS    PLAN: PT FREQUENCY: 2x/week  PT DURATION: 6 weeks  PLANNED INTERVENTIONS: Therapeutic exercises, Therapeutic activity, Neuromuscular re-education, Balance training, Gait training, Patient/Family education, Joint manipulation, Joint mobilization, Stair training, DME instructions, Dry Needling, Electrical stimulation, Cryotherapy, Moist heat, Manual lymph drainage, scar mobilization, Taping, and Manual therapy  PLAN FOR NEXT SESSION: L knee mobility, quad strength   Reola Mosher Sparkles Mcneely, PT 06/01/2022, 9:23 AM

## 2022-06-04 ENCOUNTER — Ambulatory Visit (HOSPITAL_COMMUNITY): Payer: BC Managed Care – PPO

## 2022-06-07 ENCOUNTER — Encounter: Payer: Self-pay | Admitting: Orthopedic Surgery

## 2022-06-07 ENCOUNTER — Ambulatory Visit (INDEPENDENT_AMBULATORY_CARE_PROVIDER_SITE_OTHER): Payer: BC Managed Care – PPO | Admitting: Orthopedic Surgery

## 2022-06-07 DIAGNOSIS — Z9889 Other specified postprocedural states: Secondary | ICD-10-CM

## 2022-06-08 ENCOUNTER — Ambulatory Visit (HOSPITAL_COMMUNITY): Payer: BC Managed Care – PPO | Attending: Physical Therapy | Admitting: Physical Therapy

## 2022-06-08 NOTE — Progress Notes (Signed)
   FOLLOW UP   Encounter Diagnosis  Name Primary?   S/P left knee arthroscopy 05/18/22 Yes     Chief Complaint  Patient presents with   Post-op Follow-up    Left knee scope 05/18/22 / had DVT 05/26/22 improving with both / going for therapy      Postop visit #2  Patient's arthroscopy was complicated by left leg DVT  Patient says he is doing well no problems with his Eliquis  Recommended he do home exercises continue his Eliquis and follow-up in 4 weeks

## 2022-06-10 ENCOUNTER — Ambulatory Visit (HOSPITAL_COMMUNITY): Payer: BC Managed Care – PPO | Attending: Internal Medicine | Admitting: Physical Therapy

## 2022-06-10 DIAGNOSIS — R2689 Other abnormalities of gait and mobility: Secondary | ICD-10-CM

## 2022-06-10 DIAGNOSIS — Z9889 Other specified postprocedural states: Secondary | ICD-10-CM | POA: Diagnosis not present

## 2022-06-10 DIAGNOSIS — G8929 Other chronic pain: Secondary | ICD-10-CM

## 2022-06-10 NOTE — Therapy (Signed)
OUTPATIENT PHYSICAL THERAPY TREATMENT NOTE   Patient Name: Derek Jones MRN: [id number] DOB:20-Jan-1971, 51 y.o., male Today's Date: 06/10/2022   PT End of Session - 06/10/22 1537     Visit Number 3    Number of Visits 12    Date for PT Re-Evaluation 07/08/22    Authorization Type [employer] Comm PPO (no auth, 60v. limit for PT/OT combined)    Progress Note Due on Visit 12    PT Start Time 1539    PT Stop Time 1620    PT Time Calculation (min) 41 min    Activity Tolerance Patient tolerated treatment well    Behavior During Therapy WFL for tasks assessed/performed             Past Medical History:  Diagnosis Date   Arthritis    Chronic knee pain    Chronic neck pain    Diabetes mellitus without complication [facility])    ED (erectile dysfunction)    History of kidney stones    Hypertension    Low back pain    Past Surgical History:  Procedure Laterality Date   KNEE ARTHROSCOPY WITH MEDIAL MENISECTOMY Left 05/18/2022   Procedure: KNEE ARTHROSCOPY WITH REMOVAL OF LOOSE BODY;  Surgeon: Carole Civil, MD;  Location: [facility];  Service: Orthopedics;  Laterality: Left;   SHOULDER SURGERY     Patient Active Problem List   Diagnosis Date Noted   S/P left knee arthroscopy 05/18/22 05/25/2022   Loose body in knee, left knee    Impaired fasting glucose 01/11/2022   Mixed hyperlipidemia 01/11/2022   Low back pain 01/07/2022   Sacral fracture, closed ([place]) 11/16/2021   Elevated blood-pressure reading, without diagnosis of hypertension 08/06/2021   Neck pain 09/23/2016   Cervical radiculopathy 03/31/2016   Knee pain, left 02/25/2014   Cervical strain 02/25/2014   Ganglion cyst 06/01/2011    PCP: Allyn Kenner MD  REFERRING PROVIDER: Arther Abbott MD  REFERRING DIAG: [phone] (ICD-10-CM) - S/P left knee arthroscopy   THERAPY DIAG:  Chronic pain of left knee  Other abnormalities of gait and mobility  Rationale for Evaluation and Treatment Rehabilitation  ONSET DATE:  05/18/22  SUBJECTIVE:   SUBJECTIVE STATEMENT: Pt states that he is stretching his knee trying to gain the mobility.   He has gone from his crutches to a cane.   PERTINENT HISTORY -Left knee scope 05/18/22 not weight bearing, using walker states calf is painful  -Postop visit #1 postop day #8 status post removal of large loose body from the anterior portion of the joint attached to his ACL tibial insertion  -KNEE ARTHROSCOPY WITH Left MEDIAL MENISECTOMY  -Pre-op diagnosis: old peripheral tear of medial meniscus of left knee Post-op diagnosis: loose body left knee   PAIN:  Are you having pain? Yes: NPRS scale: 2-3/10 Pain location: knee Pain description: ache Aggravating factors: standing or sitting long time. Leg dependent position Relieving factors: none  PRECAUTIONS: Other: DVT  Postop plan   Full weightbearing as tolerated Immediate range of motion as tolerated   No bracing needed  WEIGHT BEARING RESTRICTIONS No  FALLS:  Has patient fallen in last 6 months? No  LIVING ENVIRONMENT: Lives with: lives with their spouse Lives in: Mobile home Stairs: Yes: External: 10 steps; on left going up Has following equipment at home: Walker - 2 wheeled and Crutches  OCCUPATION: builds tires at [employer]  PLOF: Independent  PATIENT GOALS be able to bend knee better, be able to stand on  leg more and maneuver better   OBJECTIVE:   DIAGNOSTIC FINDINGS:  Medial compartment chondromalacia medial femoral condyle grade 2 with old posterior horn medial meniscus tear no new tear in the medial meniscus   Notch at the ACL footprint there was a large loose body still attached to the ACL on the tibial side there were multiple cysts attached to the structure however it was separate from the intrameniscal ligament and the main fibers of the ACL.  It did block some of his extension.   Lateral femoral condylar fissure Lateral meniscus intact   Trochlea grade 2 diffuse lesion with mild grade  II chondromalacia of the patella as well  PATIENT SURVEYS:  FOTO 38   EDEMA:  Left knee, anterior medial portion of knee/knee cap PALPATION: TTP left knee around medal left patella  LOWER EXTREMITY ROM:  Active ROM Right eval Left eval Left  06/01/22 Left  06/10/22  Hip flexion      Hip extension      Hip abduction      Hip adduction      Hip internal rotation      Hip external rotation      Knee flexion  95 degrees Passive 100 degrees 96 improves to 106 with heel slides 120  Knee extension  -25degrees    Passive, patient resisted to more extension Lacking 10  Lacking  6  Ankle dorsiflexion      Ankle plantarflexion      Ankle inversion      Ankle eversion       (Blank rows = not tested)  LOWER EXTREMITY MMT:  MMT Right eval Left eval  Hip flexion 5 5  Hip extension    Hip abduction 5 5  Hip adduction    Hip internal rotation    Hip external rotation    Knee flexion 5 3  Knee extension 5 3  Ankle dorsiflexion 5 5  Ankle plantarflexion    Ankle inversion 5 5  Ankle eversion 5 5   (Blank rows = not tested)    FUNCTIONAL TESTS:  2 minute walk test: 263fet  GAIT: Distance walked: 246  Assistive device utilized: Crutches Level of assistance: Modified independence Comments: slowed cadence, decreased weightbearing on left LE    TODAY'S TREATMENT: 6/22 Standing:              Heel raises x 15              Functional squat x 10               Knee flexion x 10               Terminal knee extension x 10           Sitting:              Sit to stand x 10               LAQ x 10           Supine:                Quad set x 10               SAQ x 10              SLR x 10               Heel slide x 10  Active hamstring stretch x 3               Bike x 5'  06/01/22 Quad set 10x 10 second holds  SLR LLE 2x 10  Heel slides with strap 1x 10 5-10 second holds Supine hamstring isometrics into green ball 10 x 5-10 second holds LAQ 10 x 5  second holds LLE   Evaluation Education on knee extension prop, weight shifting in standing    PATIENT EDUCATION:  Education details: Patient educated on exam findings, POC, scope of PT, HEP 06/01/22 HEP Person educated: Patient Education method: Consulting civil engineer, Demonstration, and Handouts Education comprehension: verbalized understanding, returned demonstration, verbal cues required, and tactile cues required    HOME EXERCISE PROGRAM: Access Code: [email or web address] Date: 06/01/2022 - Long Sitting Quad Set  - 3 x daily - 7 x weekly - 10 reps - 10 second hold - Active Straight Leg Raise with Quad Set (Mirrored)  - 3 x daily - 7 x weekly - 2 sets - 10 reps - Supine Heel Slide with Strap  - 3 x daily - 7 x weekly - 10 reps - 5-10 second hold - Seated Long Arc Quad  - 3 x daily - 7 x weekly - 10 reps - 5 second hold  ASSESSMENT:  CLINICAL IMPRESSION: Advanced HEP and added exercises to routine to focus on improving ROM.  ROM measured with noted improvement. PT will continue to benefit from skilled PT to work on ROM and strengthening to improve functional ability.    OBJECTIVE IMPAIRMENTS Abnormal gait, decreased activity tolerance, decreased balance, decreased endurance, decreased mobility, difficulty walking, decreased ROM, decreased strength, increased edema, impaired flexibility, and pain.   ACTIVITY LIMITATIONS bending, sitting, standing, squatting, stairs, transfers, and locomotion level  PARTICIPATION LIMITATIONS: cleaning, laundry, shopping, community activity, occupation, and yard work  PERSONAL FACTORS Past/current experiences and 3+ comorbidities:     Arthritis, Back pain, Diabetes Type I or II, Headaches, High Blood Pressure, Prior Surgery are also affecting patient's functional outcome.   REHAB POTENTIAL: Good  CLINICAL DECISION MAKING: Stable/uncomplicated  EVALUATION COMPLEXITY: Low   GOALS: Goals reviewed with patient? Yes  SHORT TERM GOALS: Target date:  06/17/2022  Patient will be independent with HEP in order to improve functional outcomes. Baseline:  Goal status: IN PROGRESS  2.  Patient will report at least 25% improvement in symptoms for improved quality of life. Baseline:  Goal status: IN PROGRESS  3.  Patient will be able to ambulate 250' no AD at Independent with equal step length. Baseline: reliance on crutches at 250'. Without crutches patient minimally bears any weight on left LE and hops off onto right LE as quick as possible.  Goal status: IN PROGRESS   LONG TERM GOALS: Target date: 07/08/2022  Patient will report at least 75% improvement in symptoms for improved quality of life. Baseline:  Goal status: IN PROGRESS  2.  Patient will improve FOTO score by at least 15 points in order to indicate improved tolerance to activity. Baseline: 38 Goal status: IN PROGRESS  3.  Patient will improve left knee extension to >= 4/5 to improve LE use in performing stairs/transfers  Baseline: 3/5 Goal status: IN PROGRESS  4.  Patient will be able to ambulate at least 440' feet in 2MWT in order to demonstrate improved tolerance to activity. Baseline: 246' Goal status: IN PROGRESS  5.  Patient will improve ROM for left  knee extension/flexion to -10-115 degrees to improve squatting, and other functional mobility. Baseline: -25-95 Goal  status: MET    PLAN: PT FREQUENCY: 2x/week  PT DURATION: 6 weeks  PLANNED INTERVENTIONS: Therapeutic exercises, Therapeutic activity, Neuromuscular re-education, Balance training, Gait training, Patient/Family education, Joint manipulation, Joint mobilization, Stair training, DME instructions, Dry Needling, Electrical stimulation, Cryotherapy, Moist heat, Manual lymph drainage, scar mobilization, Taping, and Manual therapy  PLAN FOR NEXT SESSION: L knee mobility, quad strength  Rayetta Humphrey, PT CLT [phone]

## 2022-06-17 ENCOUNTER — Encounter (HOSPITAL_COMMUNITY): Payer: BC Managed Care – PPO

## 2022-06-17 ENCOUNTER — Telehealth (HOSPITAL_COMMUNITY): Payer: Self-pay

## 2022-06-17 ENCOUNTER — Telehealth: Payer: Self-pay

## 2022-06-17 NOTE — Telephone Encounter (Signed)
No show, called and left message concerning missed apt today.  Reminded next apt date and time with contact number included and requested a call if pt unable to make next apt.  Becky Sax, LPTA/CLT; [place] [phone]

## 2022-06-17 NOTE — Telephone Encounter (Signed)
Patient called to let us know that his insurance company is suppose to be faxing over some paper. He just wanted to be sure we were aware.    Please follow up on this

## 2022-06-21 NOTE — Telephone Encounter (Signed)
I returned patient's call due to administrator Toniann Fail being offsite today - states he spoke with his short term disability insurer on Friday, 06/18/22, and that they would be faxing a request for an update.

## 2022-06-23 ENCOUNTER — Encounter (HOSPITAL_COMMUNITY): Payer: Self-pay | Admitting: Physical Therapy

## 2022-06-23 ENCOUNTER — Ambulatory Visit (HOSPITAL_COMMUNITY): Payer: BC Managed Care – PPO | Attending: Orthopedic Surgery | Admitting: Physical Therapy

## 2022-06-23 DIAGNOSIS — G8929 Other chronic pain: Secondary | ICD-10-CM

## 2022-06-23 DIAGNOSIS — Z471 Aftercare following joint replacement surgery: Secondary | ICD-10-CM | POA: Insufficient documentation

## 2022-06-23 DIAGNOSIS — Z9889 Other specified postprocedural states: Secondary | ICD-10-CM | POA: Diagnosis not present

## 2022-06-23 DIAGNOSIS — Z96652 Presence of left artificial knee joint: Secondary | ICD-10-CM | POA: Diagnosis not present

## 2022-06-23 DIAGNOSIS — R2689 Other abnormalities of gait and mobility: Secondary | ICD-10-CM

## 2022-06-23 NOTE — Therapy (Signed)
OUTPATIENT PHYSICAL THERAPY TREATMENT NOTE   Patient Name: Derek Jones MRN: [id number] DOB:1970-12-30, 51 y.o., male Today's Date: 06/23/2022   PT End of Session - 06/23/22 0903     Visit Number 4    Number of Visits 12    Date for PT Re-Evaluation 07/08/22    Authorization Type [employer] PPO (no auth, 60v. limit for PT/OT combined)    Progress Note Due on Visit 12    PT Start Time 0904    PT Stop Time 0944    PT Time Calculation (min) 40 min    Activity Tolerance Patient tolerated treatment well    Behavior During Therapy [facility] for tasks assessed/performed             Past Medical History:  Diagnosis Date   Arthritis    Chronic knee pain    Chronic neck pain    Diabetes mellitus without complication [facility])    ED (erectile dysfunction)    History of kidney stones    Hypertension    Low back pain    Past Surgical History:  Procedure Laterality Date   KNEE ARTHROSCOPY WITH MEDIAL MENISECTOMY Left 05/18/2022   Procedure: KNEE ARTHROSCOPY WITH REMOVAL OF LOOSE BODY;  Surgeon: Carole Civil, MD;  Location: [facility];  Service: Orthopedics;  Laterality: Left;   SHOULDER SURGERY     Patient Active Problem List   Diagnosis Date Noted   S/P left knee arthroscopy 05/18/22 05/25/2022   Loose body in knee, left knee    Impaired fasting glucose 01/11/2022   Mixed hyperlipidemia 01/11/2022   Low back pain 01/07/2022   Sacral fracture, closed ([place]) 11/16/2021   Elevated blood-pressure reading, without diagnosis of hypertension 08/06/2021   Neck pain 09/23/2016   Cervical radiculopathy 03/31/2016   Knee pain, left 02/25/2014   Cervical strain 02/25/2014   Ganglion cyst 06/01/2011    PCP: Allyn Kenner MD  REFERRING PROVIDER: Arther Abbott MD  REFERRING DIAG: [phone] (ICD-10-CM) - S/P left knee arthroscopy   THERAPY DIAG:  Chronic pain of left knee  Other abnormalities of gait and mobility  Rationale for Evaluation and Treatment Rehabilitation  ONSET DATE:  05/18/22  SUBJECTIVE:   SUBJECTIVE STATEMENT: Patein states pain is improving. He is walking better, keeps cane just for balance and mobility.   PERTINENT HISTORY -Left knee scope 05/18/22 not weight bearing, using walker states calf is painful  -Postop visit #1 postop day #8 status post removal of large loose body from the anterior portion of the joint attached to his ACL tibial insertion  -KNEE ARTHROSCOPY WITH Left MEDIAL MENISECTOMY  -Pre-op diagnosis: old peripheral tear of medial meniscus of left knee Post-op diagnosis: loose body left knee   PAIN:  Are you having pain? Yes: NPRS scale: 1-2/10 Pain location: knee Pain description: ache Aggravating factors: standing or sitting long time. Leg dependent position Relieving factors: none  PRECAUTIONS: Other: DVT  Postop plan   Full weightbearing as tolerated Immediate range of motion as tolerated   No bracing needed  WEIGHT BEARING RESTRICTIONS No  FALLS:  Has patient fallen in last 6 months? No  LIVING ENVIRONMENT: Lives with: lives with their spouse Lives in: Mobile home Stairs: Yes: External: 10 steps; on left going up Has following equipment at home: Walker - 2 wheeled and Crutches  OCCUPATION: builds tires at [employer]  PLOF: Independent  PATIENT GOALS be able to bend knee better, be able to stand on leg more and maneuver better   OBJECTIVE:  DIAGNOSTIC FINDINGS:  Medial compartment chondromalacia medial femoral condyle grade 2 with old posterior horn medial meniscus tear no new tear in the medial meniscus   Notch at the ACL footprint there was a large loose body still attached to the ACL on the tibial side there were multiple cysts attached to the structure however it was separate from the intrameniscal ligament and the main fibers of the ACL.  It did block some of his extension.   Lateral femoral condylar fissure Lateral meniscus intact   Trochlea grade 2 diffuse lesion with mild grade II chondromalacia  of the patella as well  PATIENT SURVEYS:  FOTO 38   EDEMA:  Left knee, anterior medial portion of knee/knee cap PALPATION: TTP left knee around medal left patella  LOWER EXTREMITY ROM:  Active ROM Right eval Left eval Left  06/01/22 Left  06/10/22  Hip flexion      Hip extension      Hip abduction      Hip adduction      Hip internal rotation      Hip external rotation      Knee flexion  95 degrees Passive 100 degrees 96 improves to 106 with heel slides 120  Knee extension  -25degrees    Passive, patient resisted to more extension Lacking 10  Lacking  6  Ankle dorsiflexion      Ankle plantarflexion      Ankle inversion      Ankle eversion       (Blank rows = not tested)  LOWER EXTREMITY MMT:  MMT Right eval Left eval  Hip flexion 5 5  Hip extension    Hip abduction 5 5  Hip adduction    Hip internal rotation    Hip external rotation    Knee flexion 5 3  Knee extension 5 3  Ankle dorsiflexion 5 5  Ankle plantarflexion    Ankle inversion 5 5  Ankle eversion 5 5   (Blank rows = not tested)    FUNCTIONAL TESTS:  2 minute walk test: 24feet  GAIT: Distance walked: 246  Assistive device utilized: Crutches Level of assistance: Modified independence Comments: slowed cadence, decreased weightbearing on left LE    TODAY'S TREATMENT: 06/23/22 Bike 5 min AROM warmup Calf stretch 3 x30" Heel raise 2 x10 TKE RTB 20 x 3" Step up 4 inch x 20 no HHA Step down 4 inch x 20 HHA x 1  Standing hip abduction RTB 2 x 10 Standing hip extension RTB 2 x 10 Mini squat 2 x 10 Tandem stance 2 x 30"  LT knee AROM (-5 to 115 deg)   6/22 Standing:              Heel raises x 15              Functional squat x 10               Knee flexion x 10               Terminal knee extension x 10           Sitting:              Sit to stand x 10               LAQ x 10           Supine:                Quad set  x 10               SAQ x 10              SLR x 10                Heel slide x 10               Active hamstring stretch x 3               Bike x 5'  06/01/22 Quad set 10x 10 second holds  SLR LLE 2x 10  Heel slides with strap 1x 10 5-10 second holds Supine hamstring isometrics into green ball 10 x 5-10 second holds LAQ 10 x 5 second holds LLE   Evaluation Education on knee extension prop, weight shifting in standing    PATIENT EDUCATION:  Education details: Patient educated on exam findings, POC, scope of PT, HEP 06/01/22 HEP Person educated: Patient Education method: Consulting civil engineer, Demonstration, and Handouts Education comprehension: verbalized understanding, returned demonstration, verbal cues required, and tactile cues required    HOME EXERCISE PROGRAM: Access Code: [email or web address] 06/23/22  - Standing Terminal Knee Extension with Resistance  - 2-3 x daily - 7 x weekly - 2 sets - 10 reps - 3 second hold - Hip Abduction with Resistance Loop  - 2-3 x daily - 7 x weekly - 2 sets - 10 reps - 3 second hold - Hip Extension with Resistance Loop  - 2-3 x daily - 7 x weekly - 2 sets - 10 reps - 3 second hold  Date: 06/01/2022 - Long Sitting Quad Set  - 3 x daily - 7 x weekly - 10 reps - 10 second hold - Active Straight Leg Raise with Quad Set (Mirrored)  - 3 x daily - 7 x weekly - 2 sets - 10 reps - Supine Heel Slide with Strap  - 3 x daily - 7 x weekly - 10 reps - 5-10 second hold - Seated Long Arc Quad  - 3 x daily - 7 x weekly - 10 reps - 5 second hold  ASSESSMENT:  CLINICAL IMPRESSION: Patient tolerated session well today. Progressed quad and glute strength with added band resistance. Patient cued on form and body mechanics with standing hip abduction and extension, and to avoid forward lean to decreased lumbar strain/. Showing good static balance. Still challenged with descending stairs and slight AROM restriction ongoing. Issued updated HEP handout. Patient will continue to benefit from skilled therapy services to reduce remaining deficits and improve  functional ability.    OBJECTIVE IMPAIRMENTS Abnormal gait, decreased activity tolerance, decreased balance, decreased endurance, decreased mobility, difficulty walking, decreased ROM, decreased strength, increased edema, impaired flexibility, and pain.   ACTIVITY LIMITATIONS bending, sitting, standing, squatting, stairs, transfers, and locomotion level  PARTICIPATION LIMITATIONS: cleaning, laundry, shopping, community activity, occupation, and yard work  PERSONAL FACTORS Past/current experiences and 3+ comorbidities:     Arthritis, Back pain, Diabetes Type I or II, Headaches, High Blood Pressure, Prior Surgery are also affecting patient's functional outcome.   REHAB POTENTIAL: Good  CLINICAL DECISION MAKING: Stable/uncomplicated  EVALUATION COMPLEXITY: Low   GOALS: Goals reviewed with patient? Yes  SHORT TERM GOALS: Target date: 06/17/2022  Patient will be independent with HEP in order to improve functional outcomes. Baseline:  Goal status: IN PROGRESS  2.  Patient will report at least 25% improvement in symptoms for improved quality of life. Baseline:  Goal status: IN PROGRESS  3.  Patient  will be able to ambulate 250' no AD at Independent with equal step length. Baseline: reliance on crutches at 250'. Without crutches patient minimally bears any weight on left LE and hops off onto right LE as quick as possible.  Goal status: IN PROGRESS   LONG TERM GOALS: Target date: 07/08/2022  Patient will report at least 75% improvement in symptoms for improved quality of life. Baseline:  Goal status: IN PROGRESS  2.  Patient will improve FOTO score by at least 15 points in order to indicate improved tolerance to activity. Baseline: 38 Goal status: IN PROGRESS  3.  Patient will improve left knee extension to >= 4/5 to improve LE use in performing stairs/transfers  Baseline: 3/5 Goal status: IN PROGRESS  4.  Patient will be able to ambulate at least 440' feet in 2MWT in order to  demonstrate improved tolerance to activity. Baseline: 246' Goal status: IN PROGRESS  5.  Patient will improve ROM for left  knee extension/flexion to -10-115 degrees to improve squatting, and other functional mobility. Baseline: -25-95 Goal status: MET    PLAN: PT FREQUENCY: 2x/week  PT DURATION: 6 weeks  PLANNED INTERVENTIONS: Therapeutic exercises, Therapeutic activity, Neuromuscular re-education, Balance training, Gait training, Patient/Family education, Joint manipulation, Joint mobilization, Stair training, DME instructions, Dry Needling, Electrical stimulation, Cryotherapy, Moist heat, Manual lymph drainage, scar mobilization, Taping, and Manual therapy  PLAN FOR NEXT SESSION: L knee mobility, quad strength  9:28 AM, 06/23/22 Josue Hector PT DPT  Physical Therapist with [facility]  [phone]

## 2022-06-28 ENCOUNTER — Encounter (HOSPITAL_COMMUNITY): Payer: BC Managed Care – PPO

## 2022-06-28 ENCOUNTER — Telehealth (HOSPITAL_COMMUNITY): Payer: Self-pay

## 2022-06-28 ENCOUNTER — Encounter: Payer: BC Managed Care – PPO | Admitting: Orthopedic Surgery

## 2022-06-28 DIAGNOSIS — Z9889 Other specified postprocedural states: Secondary | ICD-10-CM

## 2022-06-28 NOTE — Telephone Encounter (Signed)
spoke to patient about missed appointment, reminded patient of no show policy. informed pt of next upcoming appointment.

## 2022-06-30 ENCOUNTER — Ambulatory Visit (INDEPENDENT_AMBULATORY_CARE_PROVIDER_SITE_OTHER): Payer: BC Managed Care – PPO | Admitting: Orthopedic Surgery

## 2022-06-30 ENCOUNTER — Encounter: Payer: Self-pay | Admitting: Orthopedic Surgery

## 2022-06-30 DIAGNOSIS — Z9889 Other specified postprocedural states: Secondary | ICD-10-CM

## 2022-06-30 NOTE — Progress Notes (Signed)
Chief Complaint  Patient presents with   Post-op Follow-up    05/18/22 left knee scope    Mr. Delmundo is doing well he has a DVT he is on anticoagulation.  His knee is improving.  He is having trouble going down the stairs but he is walking with no support he has 120 degrees of knee flexion full extension small effusion  Recommend he continue his anticoagulation continue his physical therapy continue out of work status follow-up in 4 weeks

## 2022-07-01 ENCOUNTER — Ambulatory Visit (HOSPITAL_COMMUNITY): Payer: BC Managed Care – PPO | Attending: Orthopedic Surgery

## 2022-07-01 ENCOUNTER — Encounter (HOSPITAL_COMMUNITY): Payer: Self-pay

## 2022-07-01 DIAGNOSIS — R2689 Other abnormalities of gait and mobility: Secondary | ICD-10-CM | POA: Insufficient documentation

## 2022-07-01 DIAGNOSIS — G8929 Other chronic pain: Secondary | ICD-10-CM | POA: Insufficient documentation

## 2022-07-01 DIAGNOSIS — M25562 Pain in left knee: Secondary | ICD-10-CM | POA: Diagnosis not present

## 2022-07-01 NOTE — Therapy (Signed)
OUTPATIENT PHYSICAL THERAPY TREATMENT NOTE   Patient Name: Derek Jones MRN: [id number] DOB:[birth year], 51 y.o., male Today's Date: 07/01/2022   PT End of Session - 07/01/22 0736     Visit Number 5    Number of Visits 12    Date for PT Re-Evaluation 07/08/22    Authorization Type [employer] Comm PPO (no auth, 60v. limit for PT/OT combined)    Progress Note Due on Visit 12    PT Start Time 0736    PT Stop Time 0815    PT Time Calculation (min) 39 min    Activity Tolerance Patient tolerated treatment well    Behavior During Therapy [facility] for tasks assessed/performed             Past Medical History:  Diagnosis Date   Arthritis    Chronic knee pain    Chronic neck pain    Diabetes mellitus without complication [facility])    ED (erectile dysfunction)    History of kidney stones    Hypertension    Low back pain    Past Surgical History:  Procedure Laterality Date   KNEE ARTHROSCOPY WITH MEDIAL MENISECTOMY Left 05/18/2022   Procedure: KNEE ARTHROSCOPY WITH REMOVAL OF LOOSE BODY;  Surgeon: Carole Civil, MD;  Location: [facility];  Service: Orthopedics;  Laterality: Left;   SHOULDER SURGERY     Patient Active Problem List   Diagnosis Date Noted   S/P left knee arthroscopy 05/18/22 05/25/2022   Loose body in knee, left knee    Impaired fasting glucose 01/11/2022   Mixed hyperlipidemia 01/11/2022   Low back pain 01/07/2022   Sacral fracture, closed ([place]) 11/16/2021   Elevated blood-pressure reading, without diagnosis of hypertension 08/06/2021   Neck pain 09/23/2016   Cervical radiculopathy 03/31/2016   Knee pain, left 02/25/2014   Cervical strain 02/25/2014   Ganglion cyst 06/01/2011    PCP: Allyn Kenner MD  REFERRING PROVIDER: Arther Abbott MD  REFERRING DIAG: [id number] (ICD-10-CM) - S/P left knee arthroscopy   THERAPY DIAG:  Chronic pain of left knee  Other abnormalities of gait and mobility  Rationale for Evaluation and Treatment Rehabilitation  ONSET DATE:  05/18/22  SUBJECTIVE:   SUBJECTIVE STATEMENT: Patient states he feels knee is improving. Has 2/10 pain in left knee when bending. Reports that feels knee more so bothersome when walks longer distances.   PERTINENT HISTORY -Left knee scope 05/18/22 not weight bearing, using walker states calf is painful  -Postop visit #1 postop day #8 status post removal of large loose body from the anterior portion of the joint attached to his ACL tibial insertion  -KNEE ARTHROSCOPY WITH Left MEDIAL MENISECTOMY  -Pre-op diagnosis: old peripheral tear of medial meniscus of left knee Post-op diagnosis: loose body left knee   PAIN:  Are you having pain? Yes: NPRS scale: 1-2/10 Pain location: knee Pain description: ache Aggravating factors: standing or sitting long time. Leg dependent position Relieving factors: none  PRECAUTIONS: Other: DVT  Postop plan   Full weightbearing as tolerated Immediate range of motion as tolerated   No bracing needed  WEIGHT BEARING RESTRICTIONS No  FALLS:  Has patient fallen in last 6 months? No  LIVING ENVIRONMENT: Lives with: lives with their spouse Lives in: Mobile home Stairs: Yes: External: 10 steps; on left going up Has following equipment at home: Walker - 2 wheeled and Crutches  OCCUPATION: builds tires at [employer]  PLOF: Independent  PATIENT GOALS be able to bend knee better, be able to  stand on leg more and maneuver better   OBJECTIVE:   DIAGNOSTIC FINDINGS:  Medial compartment chondromalacia medial femoral condyle grade 2 with old posterior horn medial meniscus tear no new tear in the medial meniscus   Notch at the ACL footprint there was a large loose body still attached to the ACL on the tibial side there were multiple cysts attached to the structure however it was separate from the intrameniscal ligament and the main fibers of the ACL.  It did block some of his extension.   Lateral femoral condylar fissure Lateral meniscus intact    Trochlea grade 2 diffuse lesion with mild grade II chondromalacia of the patella as well  PATIENT SURVEYS:  FOTO 38   EDEMA:  Left knee, anterior medial portion of knee/knee cap PALPATION: TTP left knee around medal left patella  LOWER EXTREMITY ROM:  Active ROM Right eval Left eval Left  06/01/22 Left  06/10/22  Hip flexion      Hip extension      Hip abduction      Hip adduction      Hip internal rotation      Hip external rotation      Knee flexion  95 degrees Passive 100 degrees 96 improves to 106 with heel slides 120  Knee extension  -25degrees    Passive, patient resisted to more extension Lacking 10  Lacking  6  Ankle dorsiflexion      Ankle plantarflexion      Ankle inversion      Ankle eversion       (Blank rows = not tested)  LOWER EXTREMITY MMT:  MMT Right eval Left eval  Hip flexion 5 5  Hip extension    Hip abduction 5 5  Hip adduction    Hip internal rotation    Hip external rotation    Knee flexion 5 3  Knee extension 5 3  Ankle dorsiflexion 5 5  Ankle plantarflexion    Ankle inversion 5 5  Ankle eversion 5 5   (Blank rows = not tested)    FUNCTIONAL TESTS:  2 minute walk test: 291fet  GAIT: Distance walked: 246  Assistive device utilized: Crutches Level of assistance: Modified independence Comments: slowed cadence, decreased weightbearing on left LE    TODAY'S TREATMENT: 07/01/22 Hamstring stretch with quad set 10 sec x3 b/l Knee flexion and hip flexor stretch over side of mat 10sec holds x3  Staggered stance sit to stand from elevated edge of mat, focus on more weight bearing on left Staggered stance to stand , eccentric single leg sit x8 Standing TKE 5 plates on left xF27Lateral step down  for left with b/l UE support x10 Anterior step down on left uni UE support x10 Resisted retro walk with thick resistance band to promote knee extension 40'x2 Anterior resisted walk with thick resistance loop to promote even stance time  40'x2 Treadmill anterior walk 2.075m x2 mins, lateral b/l at .93m24m2min    06/23/22 Bike 5 min AROM warmup Calf stretch 3 x30" Heel raise 2 x10 TKE RTB 20 x 3" Step up 4 inch x 20 no HHA Step down 4 inch x 20 HHA x 1  Standing hip abduction RTB 2 x 10 Standing hip extension RTB 2 x 10 Mini squat 2 x 10 Tandem stance 2 x 30"  LT knee AROM (-5 to 115 deg)   6/22 Standing:              Heel raises x  15              Functional squat x 10               Knee flexion x 10               Terminal knee extension x 10           Sitting:              Sit to stand x 10               LAQ x 10           Supine:                Quad set x 10               SAQ x 10              SLR x 10               Heel slide x 10               Active hamstring stretch x 3               Bike x 5'  06/01/22 Quad set 10x 10 second holds  SLR LLE 2x 10  Heel slides with strap 1x 10 5-10 second holds Supine hamstring isometrics into green ball 10 x 5-10 second holds LAQ 10 x 5 second holds LLE   Evaluation Education on knee extension prop, weight shifting in standing    PATIENT EDUCATION:  Education details: Patient educated on exam findings, POC, scope of PT, HEP 06/01/22 HEP Person educated: Patient Education method: Consulting civil engineer, Demonstration, and Handouts Education comprehension: verbalized understanding, returned demonstration, verbal cues required, and tactile cues required    HOME EXERCISE PROGRAM: Access Code: [email or web address] 06/23/22  - Standing Terminal Knee Extension with Resistance  - 2-3 x daily - 7 x weekly - 2 sets - 10 reps - 3 second hold - Hip Abduction with Resistance Loop  - 2-3 x daily - 7 x weekly - 2 sets - 10 reps - 3 second hold - Hip Extension with Resistance Loop  - 2-3 x daily - 7 x weekly - 2 sets - 10 reps - 3 second hold  Date: 06/01/2022 - Long Sitting Quad Set  - 3 x daily - 7 x weekly - 10 reps - 10 second hold - Active Straight Leg Raise with Quad Set (Mirrored)  - 3  x daily - 7 x weekly - 2 sets - 10 reps - Supine Heel Slide with Strap  - 3 x daily - 7 x weekly - 10 reps - 5-10 second hold - Seated Long Arc Quad  - 3 x daily - 7 x weekly - 10 reps - 5 second hold  ASSESSMENT:  CLINICAL IMPRESSION: Patient tolerated session well today. Patient ambulates in to sessio with limping gait but reports not too much pain other than walking long distances and squatting. Session focus on exercises driving weightbearing and evened stance on left LE. Patient performs exercises at appropriate level without too much compensation. No reports of increased pain. Instructed pt to ice knee after session as pt was more challenged than previous sessions. Patient will continue to benefit from skilled therapy services to reduce remaining deficits and improve functional ability.    OBJECTIVE IMPAIRMENTS Abnormal gait, decreased activity tolerance, decreased balance, decreased endurance, decreased mobility, difficulty walking,  decreased ROM, decreased strength, increased edema, impaired flexibility, and pain.   ACTIVITY LIMITATIONS bending, sitting, standing, squatting, stairs, transfers, and locomotion level  PARTICIPATION LIMITATIONS: cleaning, laundry, shopping, community activity, occupation, and yard work  PERSONAL FACTORS Past/current experiences and 3+ comorbidities:     Arthritis, Back pain, Diabetes Type I or II, Headaches, High Blood Pressure, Prior Surgery are also affecting patient's functional outcome.   REHAB POTENTIAL: Good  CLINICAL DECISION MAKING: Stable/uncomplicated  EVALUATION COMPLEXITY: Low   GOALS: Goals reviewed with patient? Yes  SHORT TERM GOALS: Target date: 06/17/2022  Patient will be independent with HEP in order to improve functional outcomes. Baseline:  Goal status: IN PROGRESS  2.  Patient will report at least 25% improvement in symptoms for improved quality of life. Baseline:  Goal status: IN PROGRESS  3.  Patient will be able to  ambulate 250' no AD at Independent with equal step length. Baseline: reliance on crutches at 250'. Without crutches patient minimally bears any weight on left LE and hops off onto right LE as quick as possible.  Goal status: IN PROGRESS   LONG TERM GOALS: Target date: 07/08/2022  Patient will report at least 75% improvement in symptoms for improved quality of life. Baseline:  Goal status: IN PROGRESS  2.  Patient will improve FOTO score by at least 15 points in order to indicate improved tolerance to activity. Baseline: 38 Goal status: IN PROGRESS  3.  Patient will improve left knee extension to >= 4/5 to improve LE use in performing stairs/transfers  Baseline: 3/5 Goal status: IN PROGRESS  4.  Patient will be able to ambulate at least 440' feet in 2MWT in order to demonstrate improved tolerance to activity. Baseline: 246' Goal status: IN PROGRESS  5.  Patient will improve ROM for left  knee extension/flexion to -10-115 degrees to improve squatting, and other functional mobility. Baseline: -25-95 Goal status: MET    PLAN: PT FREQUENCY: 2x/week  PT DURATION: 6 weeks  PLANNED INTERVENTIONS: Therapeutic exercises, Therapeutic activity, Neuromuscular re-education, Balance training, Gait training, Patient/Family education, Joint manipulation, Joint mobilization, Stair training, DME instructions, Dry Needling, Electrical stimulation, Cryotherapy, Moist heat, Manual lymph drainage, scar mobilization, Taping, and Manual therapy  PLAN FOR NEXT SESSION: L knee mobility, quad strength  10:09 AM, 07/01/22 Jancy Sprankle PT, DPT

## 2022-07-14 ENCOUNTER — Encounter (HOSPITAL_COMMUNITY): Payer: BC Managed Care – PPO | Admitting: Physical Therapy

## 2022-07-15 ENCOUNTER — Encounter (HOSPITAL_COMMUNITY): Payer: BC Managed Care – PPO | Admitting: Physical Therapy

## 2022-07-15 ENCOUNTER — Encounter (HOSPITAL_COMMUNITY): Payer: Self-pay | Admitting: Physical Therapy

## 2022-07-15 ENCOUNTER — Telehealth (HOSPITAL_COMMUNITY): Payer: Self-pay | Admitting: Physical Therapy

## 2022-07-15 NOTE — Therapy (Signed)
[facility] [address] [place], [place], [address] Phone: [phone]   Fax:  [phone]  Patient Details  Name: Derek Jones MRN: [id number] Date of Birth: [birth year] Referring Provider:  No ref. provider found  Encounter Date: 07/15/2022 PHYSICAL THERAPY DISCHARGE SUMMARY  Visits from Start of Care: 6  Current functional level related to goals / functional outcomes: Unknown as pt was discharged due to three no shows   Remaining deficits: Unknown as pt was discharged due to three no shows   Education / Equipment: Unknown as pt was discharged due to three no shows   Patient agrees to discharge. Patient goals were  unknown . Patient is being discharged due to  three no shows .  Virgina Organ, PT CLT [phone]  07/15/2022, 10:07 AM  [facility] [address] [place], [place], [address] Phone: [phone]   Fax:  [phone]

## 2022-07-15 NOTE — Telephone Encounter (Signed)
No Show: # 3 Therapist did not realize this was the third no show.  Will cancel remaining appointments and notify patient.\  Virgina Organ, PT CLT [phone]

## 2022-07-20 ENCOUNTER — Other Ambulatory Visit: Payer: Self-pay | Admitting: Orthopedic Surgery

## 2022-07-20 DIAGNOSIS — Z9889 Other specified postprocedural states: Secondary | ICD-10-CM

## 2022-07-20 DIAGNOSIS — I82432 Acute embolism and thrombosis of left popliteal vein: Secondary | ICD-10-CM

## 2022-07-21 ENCOUNTER — Ambulatory Visit (HOSPITAL_COMMUNITY): Payer: BC Managed Care – PPO | Admitting: Physical Therapy

## 2022-07-22 ENCOUNTER — Encounter (HOSPITAL_COMMUNITY): Payer: BC Managed Care – PPO

## 2022-07-26 ENCOUNTER — Encounter (HOSPITAL_COMMUNITY): Payer: BC Managed Care – PPO | Admitting: Physical Therapy

## 2022-07-28 ENCOUNTER — Encounter (HOSPITAL_COMMUNITY): Payer: BC Managed Care – PPO | Admitting: Physical Therapy

## 2022-07-28 ENCOUNTER — Encounter: Payer: Self-pay | Admitting: Orthopedic Surgery

## 2022-07-28 ENCOUNTER — Ambulatory Visit (INDEPENDENT_AMBULATORY_CARE_PROVIDER_SITE_OTHER): Payer: BC Managed Care – PPO | Admitting: Orthopedic Surgery

## 2022-07-28 DIAGNOSIS — I82432 Acute embolism and thrombosis of left popliteal vein: Secondary | ICD-10-CM

## 2022-07-28 DIAGNOSIS — E1165 Type 2 diabetes mellitus with hyperglycemia: Secondary | ICD-10-CM | POA: Diagnosis not present

## 2022-07-28 DIAGNOSIS — Z9889 Other specified postprocedural states: Secondary | ICD-10-CM

## 2022-07-28 DIAGNOSIS — I10 Essential (primary) hypertension: Secondary | ICD-10-CM | POA: Diagnosis not present

## 2022-07-28 NOTE — Progress Notes (Signed)
FOLLOW UP   Encounter Diagnoses  Name Primary?   S/P left knee arthroscopy 05/18/22 Yes   Acute deep vein thrombosis (DVT) of popliteal vein of left lower extremity [facility])      Chief Complaint  Patient presents with   Post-op Follow-up    05/18/22 left knee scope/ DVT    [name] returns after his postop DVT and is on Eliquis  He is improving gradually.  Is walking without any device  No swelling in the knee today tension full continue Eliquis for the balance of 6 months returned on the 31st for return to work

## 2022-08-03 ENCOUNTER — Encounter (HOSPITAL_COMMUNITY): Payer: BC Managed Care – PPO | Admitting: Physical Therapy

## 2022-08-04 DIAGNOSIS — E1165 Type 2 diabetes mellitus with hyperglycemia: Secondary | ICD-10-CM | POA: Diagnosis not present

## 2022-08-04 DIAGNOSIS — I10 Essential (primary) hypertension: Secondary | ICD-10-CM | POA: Diagnosis not present

## 2022-08-04 DIAGNOSIS — S322XXD Fracture of coccyx, subsequent encounter for fracture with routine healing: Secondary | ICD-10-CM | POA: Diagnosis not present

## 2022-08-05 ENCOUNTER — Encounter (HOSPITAL_COMMUNITY): Payer: BC Managed Care – PPO | Admitting: Physical Therapy

## 2022-08-10 ENCOUNTER — Encounter (HOSPITAL_COMMUNITY): Payer: BC Managed Care – PPO | Admitting: Physical Therapy

## 2022-08-12 ENCOUNTER — Encounter (HOSPITAL_COMMUNITY): Payer: BC Managed Care – PPO | Admitting: Physical Therapy

## 2022-08-19 ENCOUNTER — Encounter: Payer: Self-pay | Admitting: Orthopedic Surgery

## 2022-08-19 ENCOUNTER — Ambulatory Visit (INDEPENDENT_AMBULATORY_CARE_PROVIDER_SITE_OTHER): Payer: BC Managed Care – PPO | Admitting: Orthopedic Surgery

## 2022-08-19 DIAGNOSIS — Z9889 Other specified postprocedural states: Secondary | ICD-10-CM

## 2022-08-19 NOTE — Progress Notes (Signed)
Chief Complaint  Patient presents with   Knee Problem    L/ DOS 05/18/22 / its about 80%  better, still some pain every now and then.   Follow-up for Derek Jones his knee is doing much better.  He says he has some pain in the evening  His exam shows independent gait  Full range of motion  No effusion  No tenderness  No instability  Return to work September 11 follow-up in 4 months

## 2022-08-19 NOTE — Patient Instructions (Signed)
RTW 4 MOS

## 2022-10-25 ENCOUNTER — Encounter: Payer: Self-pay | Admitting: *Deleted

## 2022-11-16 DIAGNOSIS — E1165 Type 2 diabetes mellitus with hyperglycemia: Secondary | ICD-10-CM | POA: Diagnosis not present

## 2022-11-16 DIAGNOSIS — I10 Essential (primary) hypertension: Secondary | ICD-10-CM | POA: Diagnosis not present

## 2022-11-24 DIAGNOSIS — Z0001 Encounter for general adult medical examination with abnormal findings: Secondary | ICD-10-CM | POA: Diagnosis not present

## 2022-11-24 DIAGNOSIS — S322XXA Fracture of coccyx, initial encounter for closed fracture: Secondary | ICD-10-CM | POA: Diagnosis not present

## 2022-11-24 DIAGNOSIS — E782 Mixed hyperlipidemia: Secondary | ICD-10-CM | POA: Diagnosis not present

## 2022-11-24 DIAGNOSIS — M25512 Pain in left shoulder: Secondary | ICD-10-CM | POA: Diagnosis not present

## 2022-12-16 ENCOUNTER — Ambulatory Visit: Payer: BC Managed Care – PPO | Admitting: Orthopedic Surgery

## 2022-12-16 DIAGNOSIS — E119 Type 2 diabetes mellitus without complications: Secondary | ICD-10-CM | POA: Insufficient documentation

## 2023-02-17 ENCOUNTER — Encounter: Payer: Self-pay | Admitting: Radiology

## 2023-05-20 DIAGNOSIS — E1165 Type 2 diabetes mellitus with hyperglycemia: Secondary | ICD-10-CM | POA: Diagnosis not present

## 2023-05-20 DIAGNOSIS — I10 Essential (primary) hypertension: Secondary | ICD-10-CM | POA: Diagnosis not present

## 2023-05-26 DIAGNOSIS — S322XXD Fracture of coccyx, subsequent encounter for fracture with routine healing: Secondary | ICD-10-CM | POA: Diagnosis not present

## 2023-05-26 DIAGNOSIS — E1165 Type 2 diabetes mellitus with hyperglycemia: Secondary | ICD-10-CM | POA: Diagnosis not present

## 2023-05-26 DIAGNOSIS — M25512 Pain in left shoulder: Secondary | ICD-10-CM | POA: Diagnosis not present

## 2023-05-26 DIAGNOSIS — E782 Mixed hyperlipidemia: Secondary | ICD-10-CM | POA: Diagnosis not present

## 2023-05-26 DIAGNOSIS — S322XXA Fracture of coccyx, initial encounter for closed fracture: Secondary | ICD-10-CM | POA: Diagnosis not present

## 2023-07-12 DIAGNOSIS — Z1211 Encounter for screening for malignant neoplasm of colon: Secondary | ICD-10-CM | POA: Diagnosis not present

## 2023-11-24 DIAGNOSIS — I10 Essential (primary) hypertension: Secondary | ICD-10-CM | POA: Diagnosis not present

## 2023-11-24 DIAGNOSIS — E1165 Type 2 diabetes mellitus with hyperglycemia: Secondary | ICD-10-CM | POA: Diagnosis not present

## 2023-11-29 DIAGNOSIS — I10 Essential (primary) hypertension: Secondary | ICD-10-CM | POA: Diagnosis not present

## 2023-11-29 DIAGNOSIS — M25562 Pain in left knee: Secondary | ICD-10-CM | POA: Diagnosis not present

## 2023-11-29 DIAGNOSIS — E782 Mixed hyperlipidemia: Secondary | ICD-10-CM | POA: Diagnosis not present

## 2023-11-29 DIAGNOSIS — E1165 Type 2 diabetes mellitus with hyperglycemia: Secondary | ICD-10-CM | POA: Diagnosis not present

## 2024-05-25 DIAGNOSIS — I10 Essential (primary) hypertension: Secondary | ICD-10-CM | POA: Diagnosis not present

## 2024-05-25 DIAGNOSIS — E1165 Type 2 diabetes mellitus with hyperglycemia: Secondary | ICD-10-CM | POA: Diagnosis not present

## 2024-05-31 DIAGNOSIS — Z0000 Encounter for general adult medical examination without abnormal findings: Secondary | ICD-10-CM | POA: Diagnosis not present

## 2024-05-31 DIAGNOSIS — E782 Mixed hyperlipidemia: Secondary | ICD-10-CM | POA: Diagnosis not present

## 2024-05-31 DIAGNOSIS — I10 Essential (primary) hypertension: Secondary | ICD-10-CM | POA: Diagnosis not present

## 2024-05-31 DIAGNOSIS — M25562 Pain in left knee: Secondary | ICD-10-CM | POA: Diagnosis not present

## 2024-05-31 DIAGNOSIS — E1165 Type 2 diabetes mellitus with hyperglycemia: Secondary | ICD-10-CM | POA: Diagnosis not present

## 2024-06-28 DIAGNOSIS — I10 Essential (primary) hypertension: Secondary | ICD-10-CM | POA: Diagnosis not present

## 2024-12-25 ENCOUNTER — Encounter (HOSPITAL_COMMUNITY): Payer: Self-pay

## 2024-12-25 ENCOUNTER — Other Ambulatory Visit: Payer: Self-pay

## 2024-12-25 ENCOUNTER — Emergency Department (HOSPITAL_COMMUNITY)
Admission: EM | Admit: 2024-12-25 | Discharge: 2024-12-26 | Disposition: A | Discharge status: Home / Self Care | Attending: Emergency Medicine | Admitting: Emergency Medicine

## 2024-12-25 DIAGNOSIS — S61212A Laceration without foreign body of right middle finger without damage to nail, initial encounter: Secondary | ICD-10-CM | POA: Diagnosis present

## 2024-12-25 DIAGNOSIS — Z23 Encounter for immunization: Secondary | ICD-10-CM | POA: Diagnosis not present

## 2024-12-25 DIAGNOSIS — W268XXA Contact with other sharp object(s), not elsewhere classified, initial encounter: Secondary | ICD-10-CM | POA: Insufficient documentation

## 2024-12-25 DIAGNOSIS — Z7901 Long term (current) use of anticoagulants: Secondary | ICD-10-CM | POA: Insufficient documentation

## 2024-12-25 NOTE — ED Triage Notes (Signed)
 Pt states that he cut the tip of his finger today around 11pm. Pt doesn't know last tetanus shot. Bleeding controlled.

## 2024-12-26 MED ORDER — LIDOCAINE HCL (PF) 1 % IJ SOLN
INTRAMUSCULAR | Status: AC
  Filled 2024-12-26: qty 30

## 2024-12-26 MED ORDER — TETANUS-DIPHTH-ACELL PERTUSSIS 5-2-15.5 LF-MCG/0.5 IM SUSP
0.5000 mL | Freq: Once | INTRAMUSCULAR | Status: AC
  Administered 2024-12-26: 0.5 mL via INTRAMUSCULAR
  Filled 2024-12-26: qty 0.5

## 2024-12-26 NOTE — ED Provider Notes (Signed)
 "  [place] EMERGENCY DEPARTMENT AT [facility]  Provider Note  CSN: [id number] Arrival date & time: 12/25/24 2103  History Chief Complaint  Patient presents with   Finger Injury    Derek Jones is a 54 y.o. male reports around 1100hrs on the day of arrival he was using a pipecutter when he sustained a laceration to the ulnar margin of the tip of his R middle finger. He wrapped it up and went about his day but was having increased pain and continued bleeding this evening prompting ED visit. Unsure last TDAP.    Home Medications Prior to Admission medications  Medication Sig Start Date End Date Taking? Authorizing Provider  apixaban  (ELIQUIS ) 5 MG TABS tablet Take 1 tablet (5 mg total) by mouth 2 (two) times daily. 07/20/22   Margrette Taft BRAVO, MD  ibuprofen  (ADVIL ) 800 MG tablet Take 1 tablet (800 mg total) by mouth every 8 (eight) hours as needed. 05/18/22   Margrette Taft BRAVO, MD  lisinopril (ZESTRIL) 10 MG tablet Take 10 mg by mouth daily. 04/29/22   [provider]  metFORMIN (GLUCOPHAGE-XR) 500 MG 24 hr tablet Take 500 mg by mouth daily. 04/29/22   [provider]     Allergies    Codeine and Hydrocodone    Review of Systems   Review of Systems Please see HPI for pertinent positives and negatives  Physical Exam BP (!) 146/98 (BP Location: Left Arm)   Pulse 82   Temp 98.6 F (37 C) (Oral)   Resp 18   Wt 97.5 kg   SpO2 98%   BMI 30.85 kg/m   Physical Exam Vitals and nursing note reviewed.  HENT:     Head: Normocephalic.     Nose: Nose normal.  Eyes:     Extraocular Movements: Extraocular movements intact.  Pulmonary:     Effort: Pulmonary effort is normal.  Musculoskeletal:        General: Normal range of motion.     Cervical back: Neck supple.     Comments: 1cm x 1cm area of avulsed skin to the ulnar margin of the R middle finger, bleeding controlled, not amenable to repair  Skin:    Findings: No rash (on exposed skin).   Neurological:     Mental Status: He is alert and oriented to person, place, and time.  Psychiatric:        Mood and Affect: Mood normal.     ED Results / Procedures / Treatments   EKG None  Procedures Procedures  Medications Ordered in the ED Medications  lidocaine  (PF) (XYLOCAINE ) 1 % injection (has no administration in time range)  Tdap (ADACEL ) injection 0.5 mL (has no administration in time range)    Initial Impression and Plan  Patient here with laceration to finger, not amenable to repair. Finger was anesthetized with digital block, 1% lidocaine  without epi to relieve pain and facilitate wound care. TDAP updated, wound care instructions given. Wound will need to heal by secondary intention. PCP follow up, RTED for any other concerns.    ED Course       MDM Rules/Calculators/A&P Medical Decision Making Problems Addressed: Laceration of right middle finger without foreign body without damage to nail, initial encounter: acute illness or injury  Risk Prescription drug management.     Final Clinical Impression(s) / ED Diagnoses Final diagnoses:  Laceration of right middle finger without foreign body without damage to nail, initial encounter    Rx / DC Orders ED  Discharge Orders     None        Roselyn Carlin NOVAK, MD 12/26/24 0131  "
